# Patient Record
Sex: Male | Born: 1975 | Race: White | Hispanic: No | Smoking: Current every day smoker
Health system: Southern US, Community
[De-identification: ages and names within clinical notes are randomized; demographics above are authoritative.]

## PROBLEM LIST (undated history)

## (undated) DIAGNOSIS — F1910 Other psychoactive substance abuse, uncomplicated: Secondary | ICD-10-CM

---

## 2005-07-11 ENCOUNTER — Emergency Department: Payer: Self-pay | Admitting: Emergency Medicine

## 2006-01-01 ENCOUNTER — Emergency Department: Payer: Self-pay | Admitting: General Practice

## 2006-02-12 ENCOUNTER — Emergency Department: Payer: Self-pay | Admitting: Emergency Medicine

## 2006-03-25 ENCOUNTER — Emergency Department: Payer: Self-pay | Admitting: Emergency Medicine

## 2006-05-12 ENCOUNTER — Emergency Department: Payer: Self-pay

## 2009-03-02 ENCOUNTER — Emergency Department: Payer: Self-pay | Admitting: Emergency Medicine

## 2009-10-03 ENCOUNTER — Emergency Department: Payer: Self-pay | Admitting: Emergency Medicine

## 2009-10-13 ENCOUNTER — Emergency Department: Payer: Self-pay | Admitting: Emergency Medicine

## 2012-04-19 ENCOUNTER — Emergency Department: Payer: Self-pay | Admitting: Emergency Medicine

## 2012-12-04 ENCOUNTER — Emergency Department: Payer: Self-pay | Admitting: Emergency Medicine

## 2012-12-04 LAB — CBC
HCT: 44.5 % (ref 40.0–52.0)
HGB: 15.5 g/dL (ref 13.0–18.0)
MCH: 32.5 pg (ref 26.0–34.0)
MCHC: 34.9 g/dL (ref 32.0–36.0)
MCV: 93 fL (ref 80–100)
Platelet: 222 10*3/uL (ref 150–440)
RBC: 4.78 10*6/uL (ref 4.40–5.90)
RDW: 13.3 % (ref 11.5–14.5)
WBC: 15.9 10*3/uL — ABNORMAL HIGH (ref 3.8–10.6)

## 2012-12-04 LAB — URINALYSIS, COMPLETE
Bilirubin,UR: NEGATIVE
Glucose,UR: NEGATIVE mg/dL (ref 0–75)
Ketone: NEGATIVE
Nitrite: NEGATIVE
Ph: 9 (ref 4.5–8.0)
Squamous Epithelial: NONE SEEN
WBC UR: NONE SEEN /HPF (ref 0–5)

## 2012-12-04 LAB — COMPREHENSIVE METABOLIC PANEL
Alkaline Phosphatase: 122 U/L (ref 50–136)
Anion Gap: 4 — ABNORMAL LOW (ref 7–16)
BUN: 11 mg/dL (ref 7–18)
Calcium, Total: 8.6 mg/dL (ref 8.5–10.1)
Chloride: 109 mmol/L — ABNORMAL HIGH (ref 98–107)
Creatinine: 0.78 mg/dL (ref 0.60–1.30)
EGFR (African American): >60
EGFR (Non-African Amer.): >60
Glucose: 88 mg/dL (ref 65–99)
Osmolality: 280 (ref 275–301)
SGOT(AST): 26 U/L (ref 15–37)
SGPT (ALT): 22 U/L (ref 12–78)
Total Protein: 6.9 g/dL (ref 6.4–8.2)

## 2012-12-04 LAB — CK: CK, Total: 115 U/L (ref 35–232)

## 2012-12-04 LAB — SEDIMENTATION RATE: Erythrocyte Sed Rate: 1 mm/hr (ref 0–15)

## 2014-06-14 ENCOUNTER — Emergency Department: Payer: Self-pay | Admitting: Emergency Medicine

## 2016-07-15 ENCOUNTER — Emergency Department
Admission: EM | Admit: 2016-07-15 | Discharge: 2016-07-16 | Disposition: A | Discharge status: Home / Self Care | Payer: Self-pay | Attending: Emergency Medicine | Admitting: Emergency Medicine

## 2016-07-15 DIAGNOSIS — F10120 Alcohol abuse with intoxication, uncomplicated: Secondary | ICD-10-CM | POA: Insufficient documentation

## 2016-07-15 DIAGNOSIS — F10920 Alcohol use, unspecified with intoxication, uncomplicated: Secondary | ICD-10-CM

## 2016-07-15 DIAGNOSIS — F17210 Nicotine dependence, cigarettes, uncomplicated: Secondary | ICD-10-CM | POA: Insufficient documentation

## 2016-07-15 DIAGNOSIS — Y929 Unspecified place or not applicable: Secondary | ICD-10-CM | POA: Insufficient documentation

## 2016-07-15 DIAGNOSIS — Y9389 Activity, other specified: Secondary | ICD-10-CM | POA: Insufficient documentation

## 2016-07-15 DIAGNOSIS — S0083XA Contusion of other part of head, initial encounter: Secondary | ICD-10-CM

## 2016-07-15 DIAGNOSIS — Z23 Encounter for immunization: Secondary | ICD-10-CM | POA: Insufficient documentation

## 2016-07-15 DIAGNOSIS — S01511A Laceration without foreign body of lip, initial encounter: Secondary | ICD-10-CM | POA: Insufficient documentation

## 2016-07-15 DIAGNOSIS — S0990XA Unspecified injury of head, initial encounter: Secondary | ICD-10-CM | POA: Insufficient documentation

## 2016-07-15 DIAGNOSIS — Y999 Unspecified external cause status: Secondary | ICD-10-CM | POA: Insufficient documentation

## 2016-07-16 ENCOUNTER — Encounter: Payer: Self-pay | Admitting: Emergency Medicine

## 2016-07-16 ENCOUNTER — Emergency Department: Payer: Self-pay

## 2016-07-16 MED ORDER — PENICILLIN V POTASSIUM 500 MG PO TABS: 500.0000 mg | ORAL_TABLET | Freq: Four times a day (QID) | ORAL | 0 refills | Status: AC

## 2016-07-16 MED ORDER — TETANUS-DIPHTH-ACELL PERTUSSIS 5-2.5-18.5 LF-MCG/0.5 IM SUSP
0.5000 mL | Freq: Once | INTRAMUSCULAR | Status: AC
  Administered 2016-07-16: 0.5 mL via INTRAMUSCULAR
  Filled 2016-07-16: qty 0.5

## 2016-07-16 MED ORDER — LIDOCAINE HCL (PF) 1 % IJ SOLN
INTRAMUSCULAR | Status: AC
  Administered 2016-07-16: 02:00:00
  Filled 2016-07-16: qty 10

## 2016-07-16 MED ORDER — PENICILLIN V POTASSIUM 250 MG PO TABS
500.0000 mg | ORAL_TABLET | Freq: Once | ORAL | Status: AC
  Administered 2016-07-16: 500 mg via ORAL
  Filled 2016-07-16: qty 2

## 2016-07-16 MED ORDER — HYDROCODONE-ACETAMINOPHEN 5-325 MG PO TABS
2.0000 | ORAL_TABLET | Freq: Once | ORAL | Status: AC
Start: 2016-07-16 — End: 2016-07-16
  Administered 2016-07-16: 2 via ORAL
  Filled 2016-07-16: qty 2

## 2016-07-16 NOTE — ED Notes (Signed)
Pt ambulatory to restroom with steady gait. No distress noted. Pt asking when he will be able to go home.
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
## 2016-07-16 NOTE — ED Notes (Signed)
Pt ambulatory to nurses station requesting something to drink. Pt provided with meal tray and soda.
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
## 2016-07-16 NOTE — Discharge Instructions (Signed)
Fortunately your CT scans today did not show any internal injuries of your head, face, nor neck.  we repaired your upper lip with sutures that do not have to be removed, and instead will dissolve on their own over time.  Please try to eat only soft foods for at least a week to lower the chance you worsen the laceration or pull out your sutures.    Return to the emergency department if you develop new or worsening symptoms that concern you.

## 2018-09-21 ENCOUNTER — Emergency Department
Admission: EM | Admit: 2018-09-21 | Discharge: 2018-09-21 | Disposition: A | Discharge status: Home / Self Care | Payer: Self-pay | Attending: Emergency Medicine | Admitting: Emergency Medicine

## 2018-09-21 ENCOUNTER — Other Ambulatory Visit: Payer: Self-pay

## 2018-09-21 DIAGNOSIS — F23 Brief psychotic disorder: Secondary | ICD-10-CM

## 2018-09-21 DIAGNOSIS — F17210 Nicotine dependence, cigarettes, uncomplicated: Secondary | ICD-10-CM | POA: Insufficient documentation

## 2018-09-21 DIAGNOSIS — R456 Violent behavior: Secondary | ICD-10-CM | POA: Insufficient documentation

## 2018-09-21 DIAGNOSIS — F1910 Other psychoactive substance abuse, uncomplicated: Secondary | ICD-10-CM

## 2018-09-21 DIAGNOSIS — F1510 Other stimulant abuse, uncomplicated: Secondary | ICD-10-CM | POA: Diagnosis present

## 2018-09-21 DIAGNOSIS — F29 Unspecified psychosis not due to a substance or known physiological condition: Secondary | ICD-10-CM | POA: Insufficient documentation

## 2018-09-21 DIAGNOSIS — R4689 Other symptoms and signs involving appearance and behavior: Secondary | ICD-10-CM | POA: Diagnosis present

## 2018-09-21 LAB — COMPREHENSIVE METABOLIC PANEL
ALK PHOS: 119 U/L (ref 38–126)
ALT: 23 U/L (ref 0–44)
AST: 45 U/L — ABNORMAL HIGH (ref 15–41)
Albumin: 4.9 g/dL (ref 3.5–5.0)
Anion gap: 18 — ABNORMAL HIGH (ref 5–15)
BUN: 18 mg/dL (ref 6–20)
CALCIUM: 9.2 mg/dL (ref 8.9–10.3)
CHLORIDE: 106 mmol/L (ref 98–111)
CO2: 15 mmol/L — AB (ref 22–32)
CREATININE: 1.62 mg/dL — AB (ref 0.61–1.24)
GFR calc Af Amer: 59 mL/min — ABNORMAL LOW (ref >60)
GFR, EST NON AFRICAN AMERICAN: 51 mL/min — AB (ref >60)
Glucose, Bld: 97 mg/dL (ref 70–99)
Potassium: 3.8 mmol/L (ref 3.5–5.1)
Sodium: 139 mmol/L (ref 135–145)
Total Bilirubin: 1.1 mg/dL (ref 0.3–1.2)
Total Protein: 7.9 g/dL (ref 6.5–8.1)

## 2018-09-21 LAB — SALICYLATE LEVEL: Salicylate Lvl: <7 mg/dL (ref 2.8–30.0)

## 2018-09-21 LAB — CBC WITH DIFFERENTIAL/PLATELET
ABS IMMATURE GRANULOCYTES: 0.15 10*3/uL — AB (ref 0.00–0.07)
BASOS ABS: 0.1 10*3/uL (ref 0.0–0.1)
BASOS PCT: 0 %
Eosinophils Absolute: 0 10*3/uL (ref 0.0–0.5)
Eosinophils Relative: 0 %
HCT: 43.3 % (ref 39.0–52.0)
Hemoglobin: 15.4 g/dL (ref 13.0–17.0)
IMMATURE GRANULOCYTES: 1 %
Lymphocytes Relative: 6 %
Lymphs Abs: 1.2 10*3/uL (ref 0.7–4.0)
MCH: 32.2 pg (ref 26.0–34.0)
MCHC: 35.6 g/dL (ref 30.0–36.0)
MCV: 90.4 fL (ref 80.0–100.0)
MONOS PCT: 7 %
Monocytes Absolute: 1.4 10*3/uL — ABNORMAL HIGH (ref 0.1–1.0)
NEUTROS PCT: 86 %
NRBC: 0 % (ref 0.0–0.2)
Neutro Abs: 17.4 10*3/uL — ABNORMAL HIGH (ref 1.7–7.7)
PLATELETS: 275 10*3/uL (ref 150–400)
RBC: 4.79 MIL/uL (ref 4.22–5.81)
RDW: 12.3 % (ref 11.5–15.5)
WBC: 20.2 10*3/uL — ABNORMAL HIGH (ref 4.0–10.5)

## 2018-09-21 LAB — ETHANOL

## 2018-09-21 LAB — ACETAMINOPHEN LEVEL: Acetaminophen (Tylenol), Serum: <10 ug/mL — ABNORMAL LOW (ref 10–30)

## 2018-09-21 MED ORDER — HALOPERIDOL LACTATE 5 MG/ML IJ SOLN
5.0000 mg | Freq: Once | INTRAMUSCULAR | Status: AC
  Administered 2018-09-21: 5 mg via INTRAMUSCULAR

## 2018-09-21 MED ORDER — LORAZEPAM 2 MG/ML IJ SOLN
INTRAMUSCULAR | Status: AC
  Administered 2018-09-21: 2 mg via INTRAMUSCULAR
  Filled 2018-09-21: qty 1

## 2018-09-21 MED ORDER — LORAZEPAM 2 MG/ML IJ SOLN
2.0000 mg | Freq: Once | INTRAMUSCULAR | Status: AC
  Administered 2018-09-21: 2 mg via INTRAMUSCULAR

## 2018-09-21 MED ORDER — DIPHENHYDRAMINE HCL 50 MG/ML IJ SOLN
50.0000 mg | Freq: Once | INTRAMUSCULAR | Status: AC
  Administered 2018-09-21: 50 mg via INTRAVENOUS

## 2018-09-21 MED ORDER — HALOPERIDOL LACTATE 5 MG/ML IJ SOLN
INTRAMUSCULAR | Status: AC
  Administered 2018-09-21: 5 mg via INTRAMUSCULAR
  Filled 2018-09-21: qty 1

## 2018-09-21 MED ORDER — DIPHENHYDRAMINE HCL 50 MG/ML IJ SOLN
INTRAMUSCULAR | Status: AC
  Administered 2018-09-21: 50 mg via INTRAVENOUS
  Filled 2018-09-21: qty 1

## 2018-09-21 NOTE — Discharge Instructions (Addendum)
Please seek medical attention and help for any thoughts about wanting to harm yourself, harm others, any concerning change in behavior, severe depression, inappropriate drug use or any other new or concerning symptoms. ° °

## 2018-09-21 NOTE — ED Notes (Signed)
Belongings given back to pt at this time. Cash unlocked from locker at this time by Engineer, materials and given back to pt. All money is accounted for an counted with pt by this RN and [employer] Technical sales engineer.
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
## 2018-09-21 NOTE — ED Notes (Signed)
Lunch provided at this time, pt refuses food , pt states " I can't eat that nasty **it " PT states he is ready to leave.

## 2018-09-21 NOTE — ED Notes (Signed)
Pt awake and alert, up to use bathroom steady gait noted.

## 2018-09-21 NOTE — ED Provider Notes (Signed)
Patient now awake and alert. Seen by psychiatry who do not feel he is danger to himself or others. Not interested in resources at this time. Will discharge.    Phineas Semen, MD 09/21/18 [id number]

## 2018-09-21 NOTE — ED Notes (Signed)
[place]S officer has called [employer] for ride for pt .

## 2018-09-21 NOTE — ED Triage Notes (Signed)
Pt arrived via [facility] from with c/o drug overdose from unknown substance thought to be crystal meth. EMS states pt was combative and unwilling to cooperative en route. EMS states that pt was more cooperative once arrived. Pt cooperating at this time with triage.
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
## 2018-09-21 NOTE — Consult Note (Signed)
[facility] Face-to-Face Psychiatry Consult   Reason for Consult:  Methamphetamine intoxication Referring Physician:  Dr. Manson Passey Patient Identification: David Bean MRN:  [id number] Principal Diagnosis: Aggressive behavior Diagnosis:  Principal Problem:   Aggressive behavior Active Problems:   Methamphetamine abuse (HCC)   Total Time spent with patient: 1 hour  Subjective:  "I hope they still have my money, I want better food than what you provided."  HPI:   David Bean is a 43 y.o. male patient presents to the emergency department via EMS and police physically restrained secondary to combativeness with reported "crystal meth use".  Police officer states that they were called for a intoxicated person in public on their arrival patient with reported delirium".  Officer states that they found crystal meth on the scene.  Patient has been allowed to sleep overnight.  Patient remained sedated throughout the morning on frequent assessment.  On reevaluation this afternoon, patient is alert and irritable.  He reports "I was having a good time."  He reports he became aggressive with the police because they became aggressive with him.  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  Now that he has been brought to the hospital, he believes his boss will find out and he could be homeless and unemployed.  Patient reports that he has other people he can stay with.  He does not want resources for the shelter.  He does not want resources for substance use treatment.  Patient denies SI, HI, AVH.  He is asking for his belongings so that he may go to see if he can resume work.  Patient does have a vehicle. Patient reports he does not have access to weapons.    Past Psychiatric History: substance abuse  Risk to Self:  Denies Risk to Others:  Denies Prior  Inpatient Therapy:  Denies Prior Outpatient Therapy:  Denies  Past Medical History: History reviewed. No pertinent past medical history. History reviewed. No pertinent surgical history. Family History: History reviewed. No pertinent family history. Family Psychiatric  History: Denies  Social History:  Social History   Substance and Sexual Activity  Alcohol Use Yes     Social History   Substance and Sexual Activity  Drug Use No    Social History   Socioeconomic History  . Marital status: Divorced    Spouse name: Not on file  . Number of children: Not on file  . Years of education: Not on file  . Highest education level: Not on file  Occupational History  . Not on file  Social Needs  . Financial resource strain: Not on file  . Food insecurity:    Worry: Not on file    Inability: Not on file  . Transportation needs:    Medical: Not on file    Non-medical: Not on file  Tobacco Use  . Smoking status: Current Every Day Smoker    Packs/day: 2.00    Types: Cigarettes  . Smokeless tobacco: Never Used  Substance and Sexual Activity  . Alcohol use: Yes  . Drug use: No  . Sexual activity: Not on file  Lifestyle  . Physical activity:    Days per week: Not on file    Minutes per session: Not on file  . Stress: Not on file  Relationships  .  Social connections:    Talks on phone: Not on file    Gets together: Not on file    Attends religious service: Not on file    Active member of club or organization: Not on file    Attends meetings of clubs or organizations: Not on file    Relationship status: Not on file  Other Topics Concern  . Not on file  Social History Narrative  . Not on file   Additional Social History:  Patient had been living with his employer.  Patient works as a Designer, fashion/clothing.   Allergies:   Allergies  Allergen Reactions  . Aspirin     Labs:  Results for orders placed or performed during the hospital encounter of 09/21/18 (from the past 48 hour(s))   Acetaminophen level     Status: Abnormal   Collection Time: 09/21/18  2:00 AM  Result Value Ref Range   Acetaminophen (Tylenol), Serum <10 (L) 10 - 30 ug/mL    Comment: (NOTE) Therapeutic concentrations vary significantly. A range of 10-30 ug/mL  may be an effective concentration for many patients. However, some  are best treated at concentrations outside of this range. Acetaminophen concentrations >150 ug/mL at 4 hours after ingestion  and >50 ug/mL at 12 hours after ingestion are often associated with  toxic reactions. Performed at [facility], [address] [place], [place] [address]   Ethanol     Status: None   Collection Time: 09/21/18  2:00 AM  Result Value Ref Range   Alcohol, Ethyl (B) <10 <10 mg/dL    Comment: (NOTE) Lowest detectable limit for serum alcohol is 10 mg/dL. For medical purposes only. Performed at [facility], [address] [place], [place] [address]   CBC with Differential/Platelet     Status: Abnormal   Collection Time: 09/21/18  2:00 AM  Result Value Ref Range   WBC 20.2 (H) 4.0 - 10.5 K/uL   RBC 4.79 4.22 - 5.81 MIL/uL   Hemoglobin 15.4 13.0 - 17.0 g/dL   HCT 53.6 64.4 - 03.4 %   MCV 90.4 80.0 - 100.0 fL   MCH 32.2 26.0 - 34.0 pg   MCHC 35.6 30.0 - 36.0 g/dL   RDW 74.2 59.5 - 63.8 %   Platelets 275 150 - 400 K/uL   nRBC 0.0 0.0 - 0.2 %   Neutrophils Relative % 86 %   Neutro Abs 17.4 (H) 1.7 - 7.7 K/uL   Lymphocytes Relative 6 %   Lymphs Abs 1.2 0.7 - 4.0 K/uL   Monocytes Relative 7 %   Monocytes Absolute 1.4 (H) 0.1 - 1.0 K/uL   Eosinophils Relative 0 %   Eosinophils Absolute 0.0 0.0 - 0.5 K/uL   Basophils Relative 0 %   Basophils Absolute 0.1 0.0 - 0.1 K/uL   Immature Granulocytes 1 %   Abs Immature Granulocytes 0.15 (H) 0.00 - 0.07 K/uL    Comment: Performed at [facility], [address] [place], [place] [address]  Comprehensive metabolic panel     Status: Abnormal   Collection Time: 09/21/18  2:00  AM  Result Value Ref Range   Sodium 139 135 - 145 mmol/L   Potassium 3.8 3.5 - 5.1 mmol/L   Chloride 106 98 - 111 mmol/L   CO2 15 (L) 22 - 32 mmol/L   Glucose, Bld 97 70 - 99 mg/dL   BUN 18 6 - 20 mg/dL   Creatinine, Ser 3.29 (H) 0.61 - 1.24 mg/dL  Calcium 9.2 8.9 - 10.3 mg/dL   Total Protein 7.9 6.5 - 8.1 g/dL   Albumin 4.9 3.5 - 5.0 g/dL   AST 45 (H) 15 - 41 U/L   ALT 23 0 - 44 U/L   Alkaline Phosphatase 119 38 - 126 U/L   Total Bilirubin 1.1 0.3 - 1.2 mg/dL   GFR calc non Af Amer 51 (L) >60 mL/min   GFR calc Af Amer 59 (L) >60 mL/min   Anion gap 18 (H) 5 - 15    Comment: Performed at [facility], [address] [place], [place] [address]  Salicylate level     Status: None   Collection Time: 09/21/18  2:00 AM  Result Value Ref Range   Salicylate Lvl <7.0 2.8 - 30.0 mg/dL    Comment: Performed at [facility], [address] [place], [place] [address]    No current facility-administered medications for this encounter.    No current outpatient medications on file.    Musculoskeletal: Strength & Muscle Tone: within normal limits Gait & Station: normal Patient leans: N/A  Psychiatric Specialty Exam: Physical Exam  Constitutional: He is oriented to person, place, and time. He appears well-developed and well-nourished. No distress.  HENT:  Head: Normocephalic and atraumatic.  Eyes: EOM are normal.  Neck: Normal range of motion.  Cardiovascular: Normal rate and regular rhythm.  Respiratory: Effort normal. No respiratory distress.  Musculoskeletal: Normal range of motion.  Neurological: He is alert and oriented to person, place, and time.  Skin: Skin is warm and dry.  Face and arms with scabbed lesions    Review of Systems  Constitutional: Negative.   Respiratory: Negative.   Cardiovascular: Negative.   Gastrointestinal: Negative.   Musculoskeletal: Negative.   Neurological: Negative.   Psychiatric/Behavioral: Positive for substance abuse  (methamphetamine). Negative for depression, hallucinations, memory loss and suicidal ideas. The patient is not nervous/anxious and does not have insomnia.     Blood pressure 108/72, pulse 73, temperature 98.4 F (36.9 C), temperature source Oral, resp. rate (!) 21, height 5\' 6"  (1.676 m), weight 81.6 kg, SpO2 99 %.Body mass index is 29.05 kg/m.  General Appearance: Disheveled  Eye Contact:  Minimal  Speech:  Clear and Coherent and Normal Rate  Volume:  Normal  Mood:  Irritable  Affect:  Congruent  Thought Process:  Coherent, Goal Directed, Linear and Descriptions of Associations: Intact  Orientation:  Full (Time, Place, and Person)  Thought Content:  Hallucinations: None  Suicidal Thoughts:  No  Homicidal Thoughts:  No  Memory:  good  Judgement:  Poor  Insight:  Shallow  Psychomotor Activity:  Normal  Concentration:  Concentration: Fair  Recall:  Good  Fund of Knowledge:  Good  Language:  Good  Akathisia:  No  Handed:  Right  AIMS (if indicated):     Assets:  Oceanographer  ADL's:  Intact  Cognition:  WNL  Sleep:   adequate     Treatment Plan Summary: Plan after metabolism patient is denying SI, HI, AVH.  He is able to contract for safety.   Patient does not desire substance use treatment.  Disposition: No evidence of imminent risk to self or others at present.   Patient does not meet criteria for psychiatric inpatient admission. Supportive therapy provided about ongoing stressors. Discussed crisis plan, support from social network, calling 911, coming to the Emergency Department, and calling Suicide Hotline.   Mariel Craft, MD 09/21/2018 10:42 AM

## 2018-09-21 NOTE — ED Notes (Signed)
assumed care of patient. As per security. Police officers called to give patient ride back home. Patient sleeping comfortably. Discharge paper given by prior nurse.

## 2019-09-04 ENCOUNTER — Emergency Department: Payer: Self-pay

## 2019-09-04 ENCOUNTER — Other Ambulatory Visit: Payer: Self-pay

## 2019-09-04 ENCOUNTER — Emergency Department
Admission: EM | Admit: 2019-09-04 | Discharge: 2019-09-05 | Disposition: A | Discharge status: Home / Self Care | Payer: Self-pay | Attending: Emergency Medicine | Admitting: Emergency Medicine

## 2019-09-04 DIAGNOSIS — F1910 Other psychoactive substance abuse, uncomplicated: Secondary | ICD-10-CM | POA: Insufficient documentation

## 2019-09-04 DIAGNOSIS — R55 Syncope and collapse: Secondary | ICD-10-CM | POA: Insufficient documentation

## 2019-09-04 DIAGNOSIS — F17210 Nicotine dependence, cigarettes, uncomplicated: Secondary | ICD-10-CM | POA: Insufficient documentation

## 2019-09-04 LAB — COMPREHENSIVE METABOLIC PANEL
ALT: 23 U/L (ref 0–44)
AST: 22 U/L (ref 15–41)
Albumin: 4.1 g/dL (ref 3.5–5.0)
Alkaline Phosphatase: 125 U/L (ref 38–126)
Anion gap: 9 (ref 5–15)
BUN: 16 mg/dL (ref 6–20)
CO2: 27 mmol/L (ref 22–32)
Calcium: 9.2 mg/dL (ref 8.9–10.3)
Chloride: 105 mmol/L (ref 98–111)
Creatinine, Ser: 0.86 mg/dL (ref 0.61–1.24)
GFR calc Af Amer: >60 mL/min (ref >60)
GFR calc non Af Amer: >60 mL/min (ref >60)
Glucose, Bld: 106 mg/dL — ABNORMAL HIGH (ref 70–99)
Potassium: 4.1 mmol/L (ref 3.5–5.1)
Sodium: 141 mmol/L (ref 135–145)
Total Bilirubin: 0.7 mg/dL (ref 0.3–1.2)
Total Protein: 7.4 g/dL (ref 6.5–8.1)

## 2019-09-04 LAB — CBC WITH DIFFERENTIAL/PLATELET
Abs Immature Granulocytes: 0.03 10*3/uL (ref 0.00–0.07)
Basophils Absolute: 0 10*3/uL (ref 0.0–0.1)
Basophils Relative: 0 %
Eosinophils Absolute: 0.3 10*3/uL (ref 0.0–0.5)
Eosinophils Relative: 2 %
HCT: 45.5 % (ref 39.0–52.0)
Hemoglobin: 15.4 g/dL (ref 13.0–17.0)
Immature Granulocytes: 0 %
Lymphocytes Relative: 25 %
Lymphs Abs: 3.1 10*3/uL (ref 0.7–4.0)
MCH: 31.3 pg (ref 26.0–34.0)
MCHC: 33.8 g/dL (ref 30.0–36.0)
MCV: 92.5 fL (ref 80.0–100.0)
Monocytes Absolute: 1.1 10*3/uL — ABNORMAL HIGH (ref 0.1–1.0)
Monocytes Relative: 9 %
Neutro Abs: 7.7 10*3/uL (ref 1.7–7.7)
Neutrophils Relative %: 64 %
Platelets: 307 10*3/uL (ref 150–400)
RBC: 4.92 MIL/uL (ref 4.22–5.81)
RDW: 12.6 % (ref 11.5–15.5)
WBC: 12.3 10*3/uL — ABNORMAL HIGH (ref 4.0–10.5)
nRBC: 0 % (ref 0.0–0.2)

## 2019-09-04 LAB — ACETAMINOPHEN LEVEL: Acetaminophen (Tylenol), Serum: <10 ug/mL — ABNORMAL LOW (ref 10–30)

## 2019-09-04 LAB — TROPONIN I (HIGH SENSITIVITY)
Troponin I (High Sensitivity): 3 ng/L (ref <18)
Troponin I (High Sensitivity): 5 ng/L (ref <18)

## 2019-09-04 LAB — SALICYLATE LEVEL: Salicylate Lvl: <7 mg/dL — ABNORMAL LOW (ref 7.0–30.0)

## 2019-09-04 LAB — ETHANOL: Alcohol, Ethyl (B): <10 mg/dL (ref <10)

## 2019-09-04 MED ORDER — LORAZEPAM 2 MG/ML IJ SOLN
0.5000 mg | Freq: Once | INTRAMUSCULAR | Status: AC
  Administered 2019-09-04: 0.5 mg via INTRAVENOUS
  Filled 2019-09-04: qty 1

## 2019-09-04 MED ORDER — SODIUM CHLORIDE 0.9 % IV BOLUS
1000.0000 mL | Freq: Once | INTRAVENOUS | Status: AC
Start: 2019-09-04 — End: 2019-09-04
  Administered 2019-09-04: 12:00:00 1000 mL via INTRAVENOUS

## 2019-09-04 MED ORDER — SODIUM CHLORIDE 0.9 % IV BOLUS
1000.0000 mL | Freq: Once | INTRAVENOUS | Status: AC
  Administered 2019-09-04: 1000 mL via INTRAVENOUS

## 2019-09-04 NOTE — ED Notes (Signed)
Pt resting at this time. RR even and unlabored. NAD noted. Son at bedside at this time.

## 2019-09-04 NOTE — ED Triage Notes (Signed)
Pt  via [facility]. 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 Pt found by coworkers. Per [place] police pt was found with possible heroin and meth by his side. Per EMS pt has been hallucinating on route. Pt disoriented x4. EDP Jessup at bedside.

## 2019-09-04 NOTE — ED Notes (Signed)
md in with pt again pt wakes up, but then goes back to sleep.

## 2019-09-04 NOTE — ED Notes (Signed)
Pt sleeping  nsr on monitor.  Family at bedside.  Iv in place

## 2019-09-04 NOTE — ED Notes (Signed)
UA to complete triage du to pt disoriented x4.

## 2019-09-04 NOTE — ED Notes (Signed)
EDP Jessup at bedside at this time providing status update to son Rawlin Reaume who is at bedside.

## 2019-09-04 NOTE — ED Provider Notes (Signed)
Took over care from Dr. Larinda Buttery. Patient continued with some somnolence but would wake up to verbal stimuli. When he would wake up he would start becoming combative. At this time do think patient's symptoms likely related to drug use. Will continue observation.   Phineas Semen, MD 09/04/19 [phone]

## 2019-09-04 NOTE — ED Notes (Signed)
Resumed care from yessica rn.  Pt sleeping  Iv in place   nsr on monitor.  Family at bedside.

## 2019-09-04 NOTE — ED Notes (Signed)
Son, David Bean at bedside at this time.  MD Jessup at bedside.

## 2019-09-04 NOTE — ED Provider Notes (Signed)
[facility] Emergency Department Provider Note   ____________________________________________   First MD Initiated Contact with Patient 09/04/19 1132     (approximate)  I have reviewed the triage vital signs and the nursing notes.   HISTORY  Chief Complaint Drug Overdose    HPI David Bean is a 44 y.o. male with past medical history of methamphetamine abuse who presents to the ED for syncope.  History is limited due to patient's altered mental status.  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  Police were called to the scene and found an unknown substance in the patient's pockets, thought to either be heroin or methamphetamines.  He arrives awake and alert but confused, having difficulty answering questions or following commands.        No past medical history on file.  Patient Active Problem List   Diagnosis Date Noted  . 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  . Aggressive behavior 09/21/2018    No past surgical history on file.  Prior to Admission medications   Not on File    Allergies Aspirin  No family history on file.  Social History Social History   Tobacco Use  . Smoking status: Current Every Day Smoker    Packs/day: 2.00    Types: Cigarettes  . Smokeless tobacco: Never Used  Substance Use Topics  . Alcohol use: Yes  . Drug use: No    Review of Systems Unable to obtain secondary to altered mental status  ____________________________________________   PHYSICAL EXAM:  VITAL SIGNS: ED Triage Vitals [09/04/19 1133]  Enc Vitals Group     BP (!) 170/129     Pulse Rate 84     Resp 18     Temp      Temp src      SpO2 100 %     Weight      Height      Head Circumference      Peak Flow      Pain Score      Pain Loc      Pain Edu?      Excl. in [place]?     Constitutional: Awake and alert, confused. Eyes: Conjunctivae are normal.  Pupils equal round and reactive to light  bilaterally. Head: Atraumatic. Nose: No congestion/rhinnorhea. Mouth/Throat: Mucous membranes are moist. Neck: Normal ROM Cardiovascular: Normal rate, regular rhythm. Grossly normal heart sounds. Respiratory: Normal respiratory effort.  No retractions. Lungs CTAB. Gastrointestinal: Soft and nontender. No distention. Genitourinary: deferred Musculoskeletal: No lower extremity tenderness nor edema. Neurologic: Unable to follow commands, moving all extremities equally. Skin:  Skin is warm, dry and intact. No rash noted. Psychiatric: Unable to assess.  ____________________________________________   LABS (all labs ordered are listed, but only abnormal results are displayed)  Labs Reviewed  CBC WITH DIFFERENTIAL/PLATELET - Abnormal; Notable for the following components:      Result Value   WBC 12.3 (*)    Monocytes Absolute 1.1 (*)    All other components within normal limits  COMPREHENSIVE METABOLIC PANEL - Abnormal; Notable for the following components:   Glucose, Bld 106 (*)    All other components within normal limits  ACETAMINOPHEN LEVEL - Abnormal; Notable for the following components:   Acetaminophen (Tylenol), Serum <10 (*)    All other components within normal limits  SALICYLATE LEVEL - Abnormal; Notable for the following components:   Salicylate Lvl <7.0 (*)    All other components within  normal limits  ETHANOL  URINALYSIS, COMPLETE (UACMP) WITH MICROSCOPIC  URINE DRUG SCREEN, QUALITATIVE ([facility] ONLY)  TROPONIN I (HIGH SENSITIVITY)  TROPONIN I (HIGH SENSITIVITY)   ____________________________________________  EKG  ED ECG REPORT I, Blake Divine, the attending physician, personally viewed and interpreted this ECG.   Date: 09/04/2019  EKG Time: 11:37  Rate: 78  Rhythm: normal sinus rhythm  Axis: Normal  Intervals:none  ST&T Change: None   PROCEDURES  Procedure(s) performed (including Critical  Care):  Procedures   ____________________________________________   INITIAL IMPRESSION / ASSESSMENT AND PLAN / ED COURSE       44 year old male with history of methamphetamine abuse presents to the ED after he was found to have passed out while working on a roof.  He arrives awake and alert, but confused with difficulty following commands, no obvious focal neurologic deficits.  EKG without evidence of arrhythmia or ischemia.  He required a small dose of Ativan to assist with CT head, which was negative.  Lab work is also unremarkable, patient now more somnolent.  We will plan to observe patient given episode likely due to drug intoxication.  If he returns to his baseline mental status, he would be appropriate for discharge home.  Patient turned over to oncoming provider.      ____________________________________________   FINAL CLINICAL IMPRESSION(S) / ED DIAGNOSES  Final diagnoses:  Syncope, unspecified syncope type  Polysubstance abuse [facility])     ED Discharge Orders    None       Note:  This document was prepared using Dragon voice recognition software and may include unintentional dictation errors.   Blake Divine, MD 09/04/19 [phone]

## 2019-09-04 NOTE — Discharge Instructions (Signed)
Please seek medical attention for any high fevers, chest pain, shortness of breath, change in behavior, persistent vomiting, bloody stool or any other new or concerning symptoms.  

## 2019-09-04 NOTE — ED Notes (Signed)
Pt continues to sleep.  nsr on monitor.  md aware.

## 2019-09-04 NOTE — ED Notes (Signed)
Pt has not voided and continues to sleep, when he wakes up, pt curses at staff.  Iv fluids infusing.

## 2019-09-05 NOTE — ED Notes (Signed)
Pt awake, iv dc'ed.  Pt ambulating without diff.  D/c inst to pt.

## 2019-09-20 DIAGNOSIS — S0292XA Unspecified fracture of facial bones, initial encounter for closed fracture: Secondary | ICD-10-CM

## 2019-09-20 HISTORY — DX: Unspecified fracture of facial bones, initial encounter for closed fracture: S02.92XA

## 2019-09-21 DIAGNOSIS — F1510 Other stimulant abuse, uncomplicated: Secondary | ICD-10-CM | POA: Insufficient documentation

## 2019-09-21 DIAGNOSIS — F1410 Cocaine abuse, uncomplicated: Secondary | ICD-10-CM | POA: Insufficient documentation

## 2019-12-20 ENCOUNTER — Emergency Department
Admission: EM | Admit: 2019-12-20 | Discharge: 2019-12-20 | Disposition: A | Discharge status: Home / Self Care | Payer: Self-pay | Attending: Emergency Medicine | Admitting: Emergency Medicine

## 2019-12-20 ENCOUNTER — Encounter: Payer: Self-pay | Admitting: Emergency Medicine

## 2019-12-20 ENCOUNTER — Other Ambulatory Visit: Payer: Self-pay

## 2019-12-20 DIAGNOSIS — F17210 Nicotine dependence, cigarettes, uncomplicated: Secondary | ICD-10-CM | POA: Insufficient documentation

## 2019-12-20 DIAGNOSIS — K649 Unspecified hemorrhoids: Secondary | ICD-10-CM | POA: Insufficient documentation

## 2019-12-20 MED ORDER — HYDROCORTISONE 1 % EX CREA: 1.0000 "application " | TOPICAL_CREAM | Freq: Two times a day (BID) | CUTANEOUS | 0 refills | Status: DC

## 2019-12-20 MED ORDER — LIDOCAINE 5 % EX OINT: 1.0000 "application " | TOPICAL_OINTMENT | CUTANEOUS | 0 refills | Status: DC | PRN

## 2019-12-20 NOTE — ED Provider Notes (Signed)
[facility] Emergency Department Provider Note   ____________________________________________    I have reviewed the triage vital signs and the nursing notes.   HISTORY  Chief Complaint Hemorrhoids     HPI David Bean is a 44 y.o. male with a history as noted below presents with complaints of hemorrhoid pain. Patient notes long hx of hemorrhoids, has never followed up with surgery in the past although admits to frequent referrals. Denies bleeding. Describes soreness with pressure and defecation. Has not taken anything for this. Works as a Theme park manager   History reviewed. No pertinent past medical history.  Patient Active Problem List   Diagnosis Date Noted  . Methamphetamine abuse ([place]) 09/21/2018  . Aggressive behavior 09/21/2018    History reviewed. No pertinent surgical history.  Prior to Admission medications   Medication Sig Start Date End Date Taking? Authorizing Provider  hydrocortisone cream (PREPARATION H) 1 % Apply 1 application topically 2 (two) times daily. 12/20/19   Lavonia Drafts, MD  lidocaine (XYLOCAINE) 5 % ointment Apply 1 application topically as needed. 12/20/19   Lavonia Drafts, MD     Allergies Aspirin  No family history on file.  Social History Social History   Tobacco Use  . Smoking status: Current Every Day Smoker    Packs/day: 2.00    Types: Cigarettes  . Smokeless tobacco: Never Used  Substance Use Topics  . Alcohol use: Yes  . Drug use: No    Review of Systems  Constitutional: No fever/chills     Gastrointestinal: No abdominal pain.  No nausea, no vomiting.  As above  Skin: Negative for rash.     ____________________________________________   PHYSICAL EXAM:  VITAL SIGNS: ED Triage Vitals  Enc Vitals Group     BP 12/20/19 1121 127/81     Pulse Rate 12/20/19 1121 72     Resp 12/20/19 1121 16     Temp 12/20/19 1121 98.5 F (36.9 C)     Temp Source 12/20/19 1121 Oral     SpO2 12/20/19  1121 99 %     Weight 12/20/19 1123 81.6 kg (180 lb)     Height 12/20/19 1123 1.753 m (5\' 9" )     Head Circumference --      Peak Flow --      Pain Score 12/20/19 1123 10     Pain Loc --      Pain Edu? --      Excl. in [place]? --     Constitutional: Alert and oriented. No acute distress.  Nose: No congestion/rhinnorhea. Mouth/Throat: Mucous membranes are moist.   Cardiovascular: Normal rate, regular rhythm.  Respiratory: Normal respiratory effort.  No retractions. Abdomen: soft, non-tender. Large non-bleeding hemorrhoid, pink, possibly partially thrombosed  Neurologic:  Normal speech and language. No gross focal neurologic deficits are appreciated.   Skin:  Skin is warm, dry and intact.    ____________________________________________   LABS (all labs ordered are listed, but only abnormal results are displayed)  Labs Reviewed - No data to display ____________________________________________  EKG   ____________________________________________  RADIOLOGY   ____________________________________________   PROCEDURES  Procedure(s) performed: No  Procedures   Critical Care performed: No ____________________________________________   INITIAL IMPRESSION / ASSESSMENT AND PLAN / ED COURSE  Pertinent labs & imaging results that were available during my care of the patient were reviewed by me and considered in my medical decision making (see chart for details).  Patient with hemorrhoid on exam. Rx for topical lidocaine, preparation  H, recommend sitz baths, stool softeners. Referral to gen surg   ____________________________________________   FINAL CLINICAL IMPRESSION(S) / ED DIAGNOSES  Final diagnoses:  Hemorrhoids, unspecified hemorrhoid type      NEW MEDICATIONS STARTED DURING THIS VISIT:  Discharge Medication List as of 12/20/2019 11:31 AM    START taking these medications   Details  hydrocortisone cream (PREPARATION H) 1 % Apply 1 application topically 2  (two) times daily., Starting Thu 12/20/2019, Print    lidocaine (XYLOCAINE) 5 % ointment Apply 1 application topically as needed., Starting Thu 12/20/2019, Print         Note:  This document was prepared using Dragon voice recognition software and may include unintentional dictation errors.   Jene Every, MD 12/20/19 1246

## 2019-12-20 NOTE — ED Triage Notes (Addendum)
Patient presents to the ED with hemorrhoid near his rectum.  Patient states he has known about area for a long time and states, "I was supposed to get this fixed the last time this happened but I didn't have time.  Patient sleeping when called in triage and was somewhat difficult to arouse.  Patient was able to walk to treatment room with steady gait but immediately wanted to go back to sleep once he was in treatment room.  Patient states, "It feels better to be asleep than to be awake with the pain."  Patient states rectal pain began yesterday.

## 2020-01-14 ENCOUNTER — Inpatient Hospital Stay (HOSPITAL_COMMUNITY)
Admission: EM | Admit: 2020-01-14 | Discharge: 2020-01-18 | DRG: 917 | Disposition: A | Discharge status: Home / Self Care | Payer: Self-pay | Attending: Internal Medicine | Admitting: Internal Medicine

## 2020-01-14 ENCOUNTER — Emergency Department (HOSPITAL_COMMUNITY): Payer: Self-pay

## 2020-01-14 ENCOUNTER — Encounter (HOSPITAL_COMMUNITY): Payer: Self-pay | Admitting: Internal Medicine

## 2020-01-14 DIAGNOSIS — Y929 Unspecified place or not applicable: Secondary | ICD-10-CM

## 2020-01-14 DIAGNOSIS — Z79899 Other long term (current) drug therapy: Secondary | ICD-10-CM

## 2020-01-14 DIAGNOSIS — Z781 Physical restraint status: Secondary | ICD-10-CM

## 2020-01-14 DIAGNOSIS — Z886 Allergy status to analgesic agent status: Secondary | ICD-10-CM

## 2020-01-14 DIAGNOSIS — F1910 Other psychoactive substance abuse, uncomplicated: Secondary | ICD-10-CM | POA: Diagnosis present

## 2020-01-14 DIAGNOSIS — G92 Toxic encephalopathy: Secondary | ICD-10-CM | POA: Diagnosis present

## 2020-01-14 DIAGNOSIS — R410 Disorientation, unspecified: Secondary | ICD-10-CM

## 2020-01-14 DIAGNOSIS — T405X1A Poisoning by cocaine, accidental (unintentional), initial encounter: Principal | ICD-10-CM | POA: Diagnosis present

## 2020-01-14 DIAGNOSIS — N179 Acute kidney failure, unspecified: Secondary | ICD-10-CM | POA: Diagnosis present

## 2020-01-14 DIAGNOSIS — R451 Restlessness and agitation: Secondary | ICD-10-CM

## 2020-01-14 DIAGNOSIS — Z590 Homelessness: Secondary | ICD-10-CM

## 2020-01-14 DIAGNOSIS — R45851 Suicidal ideations: Secondary | ICD-10-CM | POA: Diagnosis present

## 2020-01-14 DIAGNOSIS — D72829 Elevated white blood cell count, unspecified: Secondary | ICD-10-CM | POA: Diagnosis present

## 2020-01-14 DIAGNOSIS — F17210 Nicotine dependence, cigarettes, uncomplicated: Secondary | ICD-10-CM | POA: Diagnosis present

## 2020-01-14 DIAGNOSIS — E860 Dehydration: Secondary | ICD-10-CM | POA: Diagnosis present

## 2020-01-14 DIAGNOSIS — F15129 Other stimulant abuse with intoxication, unspecified: Secondary | ICD-10-CM | POA: Diagnosis present

## 2020-01-14 DIAGNOSIS — Z20822 Contact with and (suspected) exposure to covid-19: Secondary | ICD-10-CM | POA: Diagnosis present

## 2020-01-14 HISTORY — DX: Other psychoactive substance abuse, uncomplicated: F19.10

## 2020-01-14 HISTORY — DX: Acute kidney failure, unspecified: N17.9

## 2020-01-14 LAB — CBC WITH DIFFERENTIAL/PLATELET
Abs Immature Granulocytes: 0.08 10*3/uL — ABNORMAL HIGH (ref 0.00–0.07)
Basophils Absolute: 0.1 10*3/uL (ref 0.0–0.1)
Basophils Relative: 0 %
Eosinophils Absolute: 0.1 10*3/uL (ref 0.0–0.5)
Eosinophils Relative: 0 %
HCT: 51.8 % (ref 39.0–52.0)
Hemoglobin: 17.6 g/dL — ABNORMAL HIGH (ref 13.0–17.0)
Immature Granulocytes: 1 %
Lymphocytes Relative: 8 %
Lymphs Abs: 1.3 10*3/uL (ref 0.7–4.0)
MCH: 31.4 pg (ref 26.0–34.0)
MCHC: 34 g/dL (ref 30.0–36.0)
MCV: 92.3 fL (ref 80.0–100.0)
Monocytes Absolute: 1.1 10*3/uL — ABNORMAL HIGH (ref 0.1–1.0)
Monocytes Relative: 7 %
Neutro Abs: 14.3 10*3/uL — ABNORMAL HIGH (ref 1.7–7.7)
Neutrophils Relative %: 84 %
Platelets: 271 10*3/uL (ref 150–400)
RBC: 5.61 MIL/uL (ref 4.22–5.81)
RDW: 12.2 % (ref 11.5–15.5)
WBC: 16.9 10*3/uL — ABNORMAL HIGH (ref 4.0–10.5)
nRBC: 0 % (ref 0.0–0.2)

## 2020-01-14 LAB — CBG MONITORING, ED: Glucose-Capillary: 93 mg/dL (ref 70–99)

## 2020-01-14 LAB — COMPREHENSIVE METABOLIC PANEL
ALT: 20 U/L (ref 0–44)
AST: 20 U/L (ref 15–41)
Albumin: 4.7 g/dL (ref 3.5–5.0)
Alkaline Phosphatase: 138 U/L — ABNORMAL HIGH (ref 38–126)
Anion gap: 16 — ABNORMAL HIGH (ref 5–15)
BUN: 36 mg/dL — ABNORMAL HIGH (ref 6–20)
CO2: 21 mmol/L — ABNORMAL LOW (ref 22–32)
Calcium: 9.8 mg/dL (ref 8.9–10.3)
Chloride: 104 mmol/L (ref 98–111)
Creatinine, Ser: 2.23 mg/dL — ABNORMAL HIGH (ref 0.61–1.24)
GFR calc Af Amer: 40 mL/min — ABNORMAL LOW (ref >60)
GFR calc non Af Amer: 35 mL/min — ABNORMAL LOW (ref >60)
Glucose, Bld: 96 mg/dL (ref 70–99)
Potassium: 4.6 mmol/L (ref 3.5–5.1)
Sodium: 141 mmol/L (ref 135–145)
Total Bilirubin: 0.9 mg/dL (ref 0.3–1.2)
Total Protein: 8.5 g/dL — ABNORMAL HIGH (ref 6.5–8.1)

## 2020-01-14 LAB — ACETAMINOPHEN LEVEL: Acetaminophen (Tylenol), Serum: <10 ug/mL — ABNORMAL LOW (ref 10–30)

## 2020-01-14 LAB — ETHANOL: Alcohol, Ethyl (B): <10 mg/dL (ref <10)

## 2020-01-14 LAB — SARS CORONAVIRUS 2 BY RT PCR (HOSPITAL ORDER, PERFORMED IN ~~LOC~~ HOSPITAL LAB): SARS Coronavirus 2: NEGATIVE

## 2020-01-14 LAB — SALICYLATE LEVEL: Salicylate Lvl: <7 mg/dL — ABNORMAL LOW (ref 7.0–30.0)

## 2020-01-14 MED ORDER — LIDOCAINE 5 % EX OINT
1.0000 "application " | TOPICAL_OINTMENT | Freq: Two times a day (BID) | CUTANEOUS | Status: DC | PRN
  Filled 2020-01-14: qty 35.44

## 2020-01-14 MED ORDER — SODIUM CHLORIDE 0.9 % IV BOLUS
1000.0000 mL | Freq: Once | INTRAVENOUS | Status: AC
  Administered 2020-01-14: 1000 mL via INTRAVENOUS

## 2020-01-14 MED ORDER — LORAZEPAM 2 MG/ML IJ SOLN
1.0000 mg | INTRAMUSCULAR | Status: AC | PRN
  Administered 2020-01-15 – 2020-01-16 (×6): 1 mg via INTRAVENOUS
  Filled 2020-01-14 (×6): qty 1

## 2020-01-14 MED ORDER — HYDROCORTISONE 1 % EX CREA
1.0000 "application " | TOPICAL_CREAM | Freq: Two times a day (BID) | CUTANEOUS | Status: DC
  Filled 2020-01-14: qty 28

## 2020-01-14 MED ORDER — ENOXAPARIN SODIUM 40 MG/0.4ML ~~LOC~~ SOLN
40.0000 mg | Freq: Every day | SUBCUTANEOUS | Status: DC
  Filled 2020-01-14 (×3): qty 0.4

## 2020-01-14 MED ORDER — SODIUM CHLORIDE 0.9 % IV SOLN: INTRAVENOUS | Status: DC

## 2020-01-14 MED ORDER — IBUPROFEN 400 MG PO TABS: 400.0000 mg | ORAL_TABLET | Freq: Four times a day (QID) | ORAL | Status: DC | PRN

## 2020-01-14 MED ORDER — DEXTROSE-NACL 5-0.45 % IV SOLN: INTRAVENOUS | Status: AC

## 2020-01-14 MED ORDER — LORAZEPAM 1 MG PO TABS
1.0000 mg | ORAL_TABLET | ORAL | Status: AC | PRN
  Filled 2020-01-14: qty 1

## 2020-01-14 NOTE — ED Notes (Addendum)
Pt tearful. Stating "I want to leave so I can blow my brains out." Pt mumbling about his mom and wife, cussing frequently, mostly unintelligible otherwise.

## 2020-01-14 NOTE — ED Notes (Signed)
Pt placed on condom catheter. 

## 2020-01-14 NOTE — ED Triage Notes (Signed)
Pt BIB [facility] from [facility]. EMS called by a business, pt found in woods behind business, spitting, stating "go ahead and shoot me". Pt uncooperative with questioning, bug bites noted all over abdomen. VSS. NAD noted.   130/82 HR 116 RR 20 94% RA CBG 194 98.4 oral

## 2020-01-14 NOTE — ED Provider Notes (Signed)
[facility] EMERGENCY DEPARTMENT Provider Note   CSN: [id number] Arrival date & time: 01/14/20  1639     History Chief Complaint  Patient presents with   Altered Mental Status    David Bean is a 44 y.o. male.  Patient with history of polysubstance abuse including previous ED visits for methamphetamine use presents the emergency department by EMS.  Level 5 caveat due to intoxication and patient cooperation.  Per EMS report, patient was seen in the woods behind a business.  EMS was called.  Patient was acting regularly, spitting, stating "go ahead and shoot me".  Patient stated his name to registration on arrival.  He is not willing to answer other questions.        No past medical history on file.  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     No family history on file.  Social History   Tobacco Use   Smoking status: Current Every Day Smoker    Packs/day: 2.00    Types: Cigarettes   Smokeless tobacco: Never Used  Substance Use Topics   Alcohol use: Yes   Drug use: No    Home Medications Prior to Admission medications   Medication Sig Start Date End Date Taking? Authorizing Provider  hydrocortisone cream (PREPARATION H) 1 % Apply 1 application topically 2 (two) times daily. 12/20/19   Jene Every, MD  lidocaine (XYLOCAINE) 5 % ointment Apply 1 application topically as needed. 12/20/19   Jene Every, MD    Allergies    Aspirin  Review of Systems   Review of Systems  Unable to perform ROS: Mental status change    Physical Exam Updated Vital Signs BP 131/87 (BP Location: Right Arm)    Pulse 96    Temp 98.1 F (36.7 C) (Oral)    Resp 19    SpO2 97%   Physical Exam Vitals and nursing note reviewed.  Constitutional:      Appearance: He is well-developed.  HENT:     Head: Normocephalic and atraumatic.       Right Ear: External ear normal.     Left Ear: External ear normal.     Nose: Nose normal.     Mouth/Throat:     Mouth: Mucous membranes are moist.  Eyes:     General:        Right eye: No discharge.        Left eye: No discharge.     Conjunctiva/sclera: Conjunctivae normal.  Cardiovascular:     Rate and Rhythm: Normal rate and regular rhythm.     Heart sounds: Normal heart sounds.  Pulmonary:     Effort: Pulmonary effort is normal.     Breath sounds: Normal breath sounds.  Abdominal:     Palpations: Abdomen is soft.     Tenderness: There is no abdominal tenderness. There is no guarding or rebound.  Musculoskeletal:     Cervical back: Normal range of motion and neck supple.  Skin:    General: Skin is warm and dry.  Neurological:     Mental Status: He is alert.     Comments: Patient is alert, mildly agitated.  Uncooperative with neurologic exam.  Psychiatric:        Mood and Affect: Mood is depressed. Affect is tearful.     ED Results / Procedures / Treatments  Labs (all labs ordered are listed, but only abnormal results are displayed) Labs Reviewed  COMPREHENSIVE METABOLIC PANEL - Abnormal; Notable for the following components:      Result Value   CO2 21 (*)    BUN 36 (*)    Creatinine, Ser 2.23 (*)    Total Protein 8.5 (*)    Alkaline Phosphatase 138 (*)    GFR calc non Af Amer 35 (*)    GFR calc Af Amer 40 (*)    Anion gap 16 (*)    All other components within normal limits  CBC WITH DIFFERENTIAL/PLATELET - Abnormal; Notable for the following components:   WBC 16.9 (*)    Hemoglobin 17.6 (*)    Neutro Abs 14.3 (*)    Monocytes Absolute 1.1 (*)    Abs Immature Granulocytes 0.08 (*)    All other components within normal limits  SALICYLATE LEVEL - Abnormal; Notable for the following components:   Salicylate Lvl <7.0 (*)    All other components within normal limits  ACETAMINOPHEN LEVEL - Abnormal; Notable for the following components:   Acetaminophen  (Tylenol), Serum <10 (*)    All other components within normal limits  SARS CORONAVIRUS 2 BY RT PCR (HOSPITAL ORDER, PERFORMED IN [facility])  ETHANOL  RAPID URINE DRUG SCREEN, HOSP PERFORMED  CBG MONITORING, ED    EKG EKG Interpretation  Date/Time:  Monday January 14 2020 16:44:28 EDT Ventricular Rate:  95 PR Interval:    QRS Duration: 95 QT Interval:  351 QTC Calculation: 442 R Axis:   26 Text Interpretation: Sinus rhythm Left atrial enlargement Anteroseptal infarct, old No significant change since last tracing Confirmed by Linwood Dibbles [phone]) on 01/14/2020 4:46:40 PM   Radiology CT Head Wo Contrast  Result Date: 01/14/2020 CLINICAL DATA:  44 year old male with altered mental status. EXAM: CT HEAD WITHOUT CONTRAST TECHNIQUE: Contiguous axial images were obtained from the base of the skull through the vertex without intravenous contrast. COMPARISON:  Head CT dated 09/04/2019. FINDINGS: Brain: The ventricles and sulci are appropriate size for patient's age. The gray-white matter discrimination is preserved. There is no acute intracranial hemorrhage. No mass effect or midline shift. No extra-axial fluid collection. Vascular: No hyperdense vessel or unexpected calcification. Skull: Normal. Negative for fracture or focal lesion. Sinuses/Orbits: There is a 12 mm right sphenoid sinus retention cyst or polyp. The remainder of the visualized paranasal sinuses and mastoid air cells are clear. No air-fluid level. There is deviation of the nasal septum to the left. Other: None IMPRESSION: Unremarkable noncontrast CT of the brain. Electronically Signed   By: Elgie Collard M.D.   On: 01/14/2020 19:11    Procedures Procedures (including critical care time)  Medications Ordered in ED Medications - No data to display  ED Course  I have reviewed the triage vital signs and the nursing notes.  Pertinent labs & imaging results that were available during my care of the patient were reviewed  by me and considered in my medical decision making (see chart for details).  Patient seen and examined. ABC intact. Alert and somewhat agitated. Uncooperative with questioning. Work-up pending, EKG reviewed.   Vital signs reviewed and are as follows: BP 131/87 (BP Location: Right Arm)    Pulse 96    Temp 98.1 F (36.7 C) (Oral)    Resp 19    SpO2 97%   7:26 PM patient rechecked.  He is extremely tearful in the room and crying.  He is stating "just  put me out".  I asked him if he had done any drugs today and he said no.  He told me that he is living in the woods.  He is still unable or unwilling to tell me what led up to him being here today.  I discussed results with patient.  Discussed that he needs to be admitted for treatment of kidney injury.  Will call for admission.  7:51 PM Spoke with Dr. Debby Bud, [facility], who will see.     MDM Rules/Calculators/A&P                          Admit for AKI.     Final Clinical Impression(s) / ED Diagnoses Final diagnoses:  Acute kidney injury (HCC)  Polysubstance abuse (HCC)  Confusion    Rx / DC Orders ED Discharge Orders    None       Renne Crigler, Cordelia Poche 01/14/20 1952    Linwood Dibbles, MD 01/15/20 1600

## 2020-01-14 NOTE — ED Notes (Signed)
Walked in to introduce self to Pt. Pt woke up and stated that he wanted to die because his life was messed up. Then Pt stated that he was hungry and wanted a couple of sandwiches.

## 2020-01-14 NOTE — H&P (Addendum)
History and Physical    David Bean JJK:[id number] DOB: [birth year] DOA: 01/14/2020  PCP: Patient, No Pcp Per (Confirm with patient/family/NH records and if not entered, this has to be entered at [facility] point of entry) Patient coming from: home  I have personally briefly reviewed patient's old medical records in [facility]  Chief Complaint: confusion, agitation  HPI: David Bean is a 44 y.o. male with medical history significant of polysubstance abuse including previous ED visits for methamphetamine use presents the emergency department by EMS.  Level 5 caveat due to intoxication and patient cooperation.  Per EMS report, patient was seen in the woods behind a business.  EMS was called.  Patient was acting irregularly, spitting, stating "go ahead and shoot me".     ED Course: Afebrile, VSS. Patient initially uncooperative but over time became more communicative. Lab revealed AKI with Cr 2.23, up from 0;86 09/04/19. IVF started. TRH asked to admit to complete hehydration, f/u lab and consider psych consult  Review of Systems: As per HPI otherwise 10 point review of systems negative. Caveat - patient uncooperative and non-communicative  Past Medical History:  Diagnosis Date   Facial bones, closed fracture (HCC) 09/20/2019   broken nose   Polysubstance abuse (HCC)     No past surgical history on file.   Soc hx - patient will not give any information. Has had multiple ED visits for Overdose. Living situation unknown - apparently homeless.    reports that he has been smoking cigarettes. He has been smoking about 2.00 packs per day. He has never used smokeless tobacco. He reports current alcohol use. He reports that he does not use drugs.  Allergies  Allergen Reactions   Aspirin     No family history on file. Patient will not give any information   Prior to Admission medications   Medication Sig Start Date End Date Taking? Authorizing Provider  hydrocortisone cream  (PREPARATION H) 1 % Apply 1 application topically 2 (two) times daily. 12/20/19   Jene Every, MD  lidocaine (XYLOCAINE) 5 % ointment Apply 1 application topically as needed. 12/20/19   Jene Every, MD    Physical Exam: Vitals:   01/14/20 1643 01/14/20 1830 01/14/20 2118 01/14/20 2200  BP: 131/87 109/68 120/86 95/65  Pulse: 96 89 88 93  Resp: 19 (!) 27 18 20   Temp: 98.1 F (36.7 C)     TempSrc: Oral     SpO2: 97% 98% 95% 96%    Constitutional: NAD, calm, comfortable Vitals:   01/14/20 1643 01/14/20 1830 01/14/20 2118 01/14/20 2200  BP: 131/87 109/68 120/86 95/65  Pulse: 96 89 88 93  Resp: 19 (!) 27 18 20   Temp: 98.1 F (36.7 C)     TempSrc: Oral     SpO2: 97% 98% 95% 96%   General - WNWD man who appears agitated and uncooperative Patient refused physical examination by this examiner Psychiatric: Unable to assess due to lack of cooperation. Appears hostile, agitated, threatening     Labs on Admission: I have personally reviewed following labs and imaging studies  CBC: Recent Labs  Lab 01/14/20 1718  WBC 16.9*  NEUTROABS 14.3*  HGB 17.6*  HCT 51.8  MCV 92.3  PLT 271   Basic Metabolic Panel: Recent Labs  Lab 01/14/20 1718  NA 141  K 4.6  CL 104  CO2 21*  GLUCOSE 96  BUN 36*  CREATININE 2.23*  CALCIUM 9.8   GFR: CrCl cannot be calculated (Unknown ideal weight.).  Liver Function Tests: Recent Labs  Lab 01/14/20 1718  AST 20  ALT 20  ALKPHOS 138*  BILITOT 0.9  PROT 8.5*  ALBUMIN 4.7   No results for input(s): LIPASE, AMYLASE in the last 168 hours. No results for input(s): AMMONIA in the last 168 hours. Coagulation Profile: No results for input(s): INR, PROTIME in the last 168 hours. Cardiac Enzymes: No results for input(s): CKTOTAL, CKMB, CKMBINDEX, TROPONINI in the last 168 hours. BNP (last 3 results) No results for input(s): PROBNP in the last 8760 hours. HbA1C: No results for input(s): HGBA1C in the last 72 hours. CBG: Recent Labs    Lab 01/14/20 1719  GLUCAP 93   Lipid Profile: No results for input(s): CHOL, HDL, LDLCALC, TRIG, CHOLHDL, LDLDIRECT in the last 72 hours. Thyroid Function Tests: No results for input(s): TSH, T4TOTAL, FREET4, T3FREE, THYROIDAB in the last 72 hours. Anemia Panel: No results for input(s): VITAMINB12, FOLATE, FERRITIN, TIBC, IRON, RETICCTPCT in the last 72 hours. Urine analysis:    Component Value Date/Time   COLORURINE Yellow 12/04/2012 1052   APPEARANCEUR Cloudy 12/04/2012 1052   LABSPEC 1.015 12/04/2012 1052   PHURINE 9.0 12/04/2012 1052   GLUCOSEU Negative 12/04/2012 1052   HGBUR Negative 12/04/2012 1052   BILIRUBINUR Negative 12/04/2012 1052   KETONESUR Negative 12/04/2012 1052   PROTEINUR Negative 12/04/2012 1052   NITRITE Negative 12/04/2012 1052   LEUKOCYTESUR Negative 12/04/2012 1052    Radiological Exams on Admission: CT Head Wo Contrast  Result Date: 01/14/2020 CLINICAL DATA:  44 year old male with altered mental status. EXAM: CT HEAD WITHOUT CONTRAST TECHNIQUE: Contiguous axial images were obtained from the base of the skull through the vertex without intravenous contrast. COMPARISON:  Head CT dated 09/04/2019. FINDINGS: Brain: The ventricles and sulci are appropriate size for patient's age. The gray-white matter discrimination is preserved. There is no acute intracranial hemorrhage. No mass effect or midline shift. No extra-axial fluid collection. Vascular: No hyperdense vessel or unexpected calcification. Skull: Normal. Negative for fracture or focal lesion. Sinuses/Orbits: There is a 12 mm right sphenoid sinus retention cyst or polyp. The remainder of the visualized paranasal sinuses and mastoid air cells are clear. No air-fluid level. There is deviation of the nasal septum to the left. Other: None IMPRESSION: Unremarkable noncontrast CT of the brain. Electronically Signed   By: Elgie Collard M.D.   On: 01/14/2020 19:11    EKG: Independently reviewed. Sinus rhythm,  LAE, anteroseptal injury pattern  Assessment/Plan Active Problems:   Polysubstance abuse (HCC)   AKI (acute kidney injury) (HCC)  (please populate well all problems here in Problem List. (For example, if patient is on BP meds at home and you resume or decide to hold them, it is a problem that needs to be her. Same for CAD, COPD, HLD and so on)   1. AKI- suspect dehydration. Eval negative except for rise in creatinine Plan Hydrate D5 1/2 NS 2 75 cc/hr over 24 hrs  F/u BMet  2. Psychiatric - patient with aberrant behavior: hostile and uncooperative. Plan  Psychiatric consult - TTS consult order placed  Ativan 1mg  PO/IV q4 for agitation.  Addendum: patient became more agitated, hostile. He disconnected IVF, left room, was disruptive in the ED hallway and then left the building.  Plan Haldol 5 mg IM was ordered  Restraint orders entered  IVC forms completed.  Patient was forcebly returned to room in ED, put into 4 point restraints for patient and staff safety. Haldol given   Leading to adequate sedation.  DVT prophylaxis: lovenox  Code Status: full code tails) Family Communication: attempted to call son, Willam Munford, not available  Disposition Plan: TBD (specify when and where you expect patient to be discharged) Consults called: Tele psychiatry consult request via TTS order.  Admission status: observation    Illene Regulus MD [facility] Pager [phone]  If 7PM-7AM, please contact night-coverage [email or web address] Password [facility]  01/14/2020, 10:27 PM

## 2020-01-15 DIAGNOSIS — R45851 Suicidal ideations: Secondary | ICD-10-CM

## 2020-01-15 DIAGNOSIS — R451 Restlessness and agitation: Secondary | ICD-10-CM

## 2020-01-15 LAB — CBC WITH DIFFERENTIAL/PLATELET
Abs Immature Granulocytes: 0.02 10*3/uL (ref 0.00–0.07)
Basophils Absolute: 0 10*3/uL (ref 0.0–0.1)
Basophils Relative: 0 %
Eosinophils Absolute: 0.2 10*3/uL (ref 0.0–0.5)
Eosinophils Relative: 2 %
HCT: 43 % (ref 39.0–52.0)
Hemoglobin: 14.2 g/dL (ref 13.0–17.0)
Immature Granulocytes: 0 %
Lymphocytes Relative: 24 %
Lymphs Abs: 2.3 10*3/uL (ref 0.7–4.0)
MCH: 31.3 pg (ref 26.0–34.0)
MCHC: 33 g/dL (ref 30.0–36.0)
MCV: 94.9 fL (ref 80.0–100.0)
Monocytes Absolute: 0.9 10*3/uL (ref 0.1–1.0)
Monocytes Relative: 10 %
Neutro Abs: 6 10*3/uL (ref 1.7–7.7)
Neutrophils Relative %: 64 %
Platelets: 224 10*3/uL (ref 150–400)
RBC: 4.53 MIL/uL (ref 4.22–5.81)
RDW: 12.4 % (ref 11.5–15.5)
WBC: 9.6 10*3/uL (ref 4.0–10.5)
nRBC: 0 % (ref 0.0–0.2)

## 2020-01-15 LAB — HIV ANTIBODY (ROUTINE TESTING W REFLEX): HIV Screen 4th Generation wRfx: NONREACTIVE

## 2020-01-15 LAB — BASIC METABOLIC PANEL
Anion gap: 8 (ref 5–15)
BUN: 26 mg/dL — ABNORMAL HIGH (ref 6–20)
CO2: 26 mmol/L (ref 22–32)
Calcium: 8.8 mg/dL — ABNORMAL LOW (ref 8.9–10.3)
Chloride: 108 mmol/L (ref 98–111)
Creatinine, Ser: 1.1 mg/dL (ref 0.61–1.24)
GFR calc Af Amer: >60 mL/min (ref >60)
GFR calc non Af Amer: >60 mL/min (ref >60)
Glucose, Bld: 107 mg/dL — ABNORMAL HIGH (ref 70–99)
Potassium: 4.1 mmol/L (ref 3.5–5.1)
Sodium: 142 mmol/L (ref 135–145)

## 2020-01-15 LAB — RAPID URINE DRUG SCREEN, HOSP PERFORMED
Amphetamines: POSITIVE — AB
Barbiturates: NOT DETECTED
Benzodiazepines: NOT DETECTED
Cocaine: POSITIVE — AB
Opiates: NOT DETECTED
Tetrahydrocannabinol: NOT DETECTED

## 2020-01-15 LAB — TSH: TSH: 0.454 u[IU]/mL (ref 0.350–4.500)

## 2020-01-15 MED ORDER — HALOPERIDOL LACTATE 5 MG/ML IJ SOLN
5.0000 mg | Freq: Four times a day (QID) | INTRAMUSCULAR | Status: DC | PRN
  Administered 2020-01-15 (×3): 5 mg via INTRAVENOUS
  Filled 2020-01-15 (×4): qty 1

## 2020-01-15 MED ORDER — HALOPERIDOL LACTATE 5 MG/ML IJ SOLN
INTRAMUSCULAR | Status: AC
  Filled 2020-01-15: qty 1

## 2020-01-15 NOTE — Progress Notes (Addendum)
PROGRESS NOTE    David Bean  HMC:[id number] DOB: [birth year] DOA: 01/14/2020 PCP: Patient, No Pcp Per   Chief Complaint  Patient presents with  . Altered Mental Status    Brief Narrative:  David Bean is David Bean 44 y.o. male with medical history significant of polysubstance abuse including previous ED visits for methamphetamine use presents the emergency department by EMS. Level 5 caveat due to intoxication and patient cooperation. Per EMS report, patient was seen in the woods behind [employer] business. EMS was called. Patient was acting irregularly, spitting, stating "go ahead and shoot me".    ED Course: Afebrile, VSS. Patient initially uncooperative but over time became more communicative. Lab revealed AKI with Cr 2.23, up from 0;86 09/04/19. IVF started. TRH asked to admit to complete hehydration, f/u lab and consider psych consult  Assessment & Plan:   Active Problems:   Polysubstance abuse (HCC)   AKI (acute kidney injury) (HCC)  Suicidal Ideation  Agitation  Polysubstance Abuse:  Pt found in the woods, acting irregularly, saying "go ahead and shoot me".  He was brought into the ED by police.  Head CT unremarkable He's been IVC'd Suicide precautions  He's had continued agitation requiring haldol/ativan throughout the day He eloped on night of admission and had to be forcibly returned to his rooms and placed into 4 point restraints  Utox positive for amphetamines and cocaine Follow pending TSH  Sedated this morning on my evaluation Psychiatry has been unable to evaluate him as he's been sedated for agitation   Acute Kidney Injury: resolved Leukocytosis: resolved   DVT prophylaxis: lovenox Code Status: full  Family Communication: none at bedside Disposition:   Status is: Observation  The patient will require care spanning > 2 midnights and should be moved to inpatient because: Inpatient level of care appropriate due to severity of illness  Dispo: The patient is from:  Home              Anticipated d/c is to: suspect he'll need inpatient psych treatment              Anticipated d/c date is: 2 days              Patient currently is not medically stable to d/c.   Consultants:   psychiatry  Procedures:  none  Antimicrobials:  Anti-infectives (From admission, onward)   None     Subjective: Sedated, unable to interview, recently received ativan  Objective: Vitals:   01/15/20 1125 01/15/20 1422 01/15/20 1439 01/15/20 1440  BP: (!) 122/92 119/76 119/76   Pulse: 67 82 (!) 54 78  Resp: 12  20   Temp:      TempSrc:      SpO2: 99% 100% 98%     Intake/Output Summary (Last 24 hours) at 01/15/2020 1615 Last data filed at 01/14/2020 2238 Gross per 24 hour  Intake 1339.58 ml  Output --  Net 1339.58 ml   There were no vitals filed for this visit.  Examination:  General exam: Appears calm and comfortable  Respiratory system: Clear to auscultation. Respiratory effort normal. Cardiovascular system: S1 & S2 heard, RRR.  Gastrointestinal system: Abdomen is nondistended, soft and nontender.  Central nervous system: Sedated, arouses to voice/light touch. No focal neurological deficits. Extremities: moving all extremities  Skin: No rashes, lesions or ulcers Psychiatry: Judgement and insight appear normal. Mood & affect appropriate.     Data Reviewed: I have personally reviewed following labs and imaging studies  CBC: Recent  Labs  Lab 01/14/20 1718 01/15/20 0804  WBC 16.9* 9.6  NEUTROABS 14.3* 6.0  HGB 17.6* 14.2  HCT 51.8 43.0  MCV 92.3 94.9  PLT 271 224    Basic Metabolic Panel: Recent Labs  Lab 01/14/20 1718 01/15/20 0630  NA 141 142  K 4.6 4.1  CL 104 108  CO2 21* 26  GLUCOSE 96 107*  BUN 36* 26*  CREATININE 2.23* 1.10  CALCIUM 9.8 8.8*    GFR: CrCl cannot be calculated (Unknown ideal weight.).  Liver Function Tests: Recent Labs  Lab 01/14/20 1718  AST 20  ALT 20  ALKPHOS 138*  BILITOT 0.9  PROT 8.5*  ALBUMIN  4.7    CBG: Recent Labs  Lab 01/14/20 1719  GLUCAP 93     Recent Results (from the past 240 hour(s))  SARS Coronavirus 2 by RT PCR (hospital order, performed in [facility] hospital lab) Nasopharyngeal Nasopharyngeal Swab     Status: None   Collection Time: 01/14/20  7:30 PM   Specimen: Nasopharyngeal Swab  Result Value Ref Range Status   SARS Coronavirus 2 NEGATIVE NEGATIVE Final    Comment: (NOTE) SARS-CoV-2 target nucleic acids are NOT DETECTED.  The SARS-CoV-2 RNA is generally detectable in upper and lower respiratory specimens during the acute phase of infection. The lowest concentration of SARS-CoV-2 viral copies this assay can detect is 250 copies / mL. Alvino Lechuga negative result does not preclude SARS-CoV-2 infection and should not be used as the sole basis for treatment or other patient management decisions.  Frances Joynt negative result may occur with improper specimen collection / handling, submission of specimen other than nasopharyngeal swab, presence of viral mutation(s) within the areas targeted by this assay, and inadequate number of viral copies (<250 copies / mL). Braxley Balandran negative result must be combined with clinical observations, patient history, and epidemiological information.  Fact Sheet for Patients:   BoilerBrush.com.cy  Fact Sheet for Healthcare Providers: https://pope.com/  This test is not yet approved or  cleared by the [place] FDA and has been authorized for detection and/or diagnosis of SARS-CoV-2 by FDA under an Emergency Use Authorization (EUA).  This EUA will remain in effect (meaning this test can be used) for the duration of the COVID-19 declaration under Section 564(b)(1) of the Act, 21 U.S.C. section 360bbb-3(b)(1), unless the authorization is terminated or revoked sooner.  Performed at [facility], [address] [place], [place] [address]          Radiology Studies: CT Head Wo  Contrast  Result Date: 01/14/2020 CLINICAL DATA:  44 year old male with altered mental status. EXAM: CT HEAD WITHOUT CONTRAST TECHNIQUE: Contiguous axial images were obtained from the base of the skull through the vertex without intravenous contrast. COMPARISON:  Head CT dated 09/04/2019. FINDINGS: Brain: The ventricles and sulci are appropriate size for patient's age. The gray-white matter discrimination is preserved. There is no acute intracranial hemorrhage. No mass effect or midline shift. No extra-axial fluid collection. Vascular: No hyperdense vessel or unexpected calcification. Skull: Normal. Negative for fracture or focal lesion. Sinuses/Orbits: There is Remi Rester 12 mm right sphenoid sinus retention cyst or polyp. The remainder of the visualized paranasal sinuses and mastoid air cells are clear. No air-fluid level. There is deviation of the nasal septum to the left. Other: None IMPRESSION: Unremarkable noncontrast CT of the brain. Electronically Signed   By: Elgie Collard M.D.   On: 01/14/2020 19:11        Scheduled Meds: . enoxaparin (LOVENOX) injection  40 mg Subcutaneous QHS  . hydrocortisone cream  1 application Topical BID   Continuous Infusions: . dextrose 5 % and 0.45% NaCl 75 mL/hr at 01/14/20 2232     LOS: 0 days    Time spent: over 30 min    Lacretia Nicks, MD [facility]   To contact the attending provider between 7A-7P or the covering provider during after hours 7P-7A, please log into the web site [email or web address] and access using universal [place] password for that web site. If you do not have the password, please call the hospital operator.  01/15/2020, 4:15 PM

## 2020-01-15 NOTE — BH Assessment (Signed)
Call to set up TTS assessment. Per pt's RN, pt is sedated at this time. RN will call TTS at [phone] option 1 when pt is ready for TTS.

## 2020-01-15 NOTE — ED Notes (Signed)
Pt no longer in restraints on change of shift assessment. Per previous RN restraints removed at [phone].
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
## 2020-01-15 NOTE — Progress Notes (Signed)
Patient refused skin check and VS.

## 2020-01-15 NOTE — Progress Notes (Signed)
Patient is refusing to be assessed at this time.

## 2020-01-15 NOTE — ED Notes (Signed)
Pt is paranoid and delusional. Pt asked if I were with [place] and asked me to raise my pants leg. Pt stated that he wanted ativan but refused to allow me to scan him. Pt refused ativan.

## 2020-01-15 NOTE — ED Notes (Signed)
Breakfast ordered--David Bean  

## 2020-01-15 NOTE — ED Notes (Signed)
TTS attempted assessment. Per Maralyn Sago, RN, patient is currently being restrained, patient unable to participate in assessment. TTS will complete assessment at later time.

## 2020-01-15 NOTE — ED Notes (Signed)
Pt pulled restraints off wrist with teeth. Pt placed back in restraints. Pt was emotional and crying. Pt went back to sleep after placing back in restraints

## 2020-01-15 NOTE — BH Assessment (Signed)
Per pt's RN, pt is not alert and continues to be uncooperative and in restraints. Will reattempt TTS assessment later in day.

## 2020-01-15 NOTE — ED Notes (Signed)
Pt refused temp 

## 2020-01-15 NOTE — ED Notes (Signed)
At this time patient is noted to be restless and thrashing when awaken. Removed BLE restraints. Will administer PRN ativan at this time.

## 2020-01-15 NOTE — ED Notes (Signed)
Pt got out of restraints after reciveing haldol. Pt cussed out Research officer, trade union. Pt and restraints repositioned

## 2020-01-15 NOTE — Progress Notes (Signed)
NEW ADMISSION NOTE New Admission Note:   Arrival Method: Patient arrived from ED on stretcher accompanied by 2 staff members. Mental Orientation: Alert to self, sleeping. Telemetry: N/A Assessment: Completed Skin: refused  IV: R AC D5 1/2NS @75ml  Pain: none. Tubes: N/A Safety Measures: Safety Fall Prevention Plan has been given, discussed and signed Admission: in process. [facility] [place] Orientation: Patient has been orientated to the room, unit and staff.   Orders have been reviewed and implemented. Will continue to monitor the patient. Call light has been placed within reach and bed alarm has been activated.   , RN

## 2020-01-15 NOTE — ED Notes (Signed)
Have been charting violent restraimnts every 15 min under non-violent restraimnts

## 2020-01-15 NOTE — ED Notes (Signed)
When checking Pt's wrist and ankles Pt awoke and attempted to remove restraints with his mouth again. Pt stated that he wished he could die and began crying. Pt fell back asleep shortly thereafter.

## 2020-01-15 NOTE — ED Notes (Signed)
Pt becoming agitated, ripping off med wires, attempting to get out of bed. Pt refusing VS.

## 2020-01-16 DIAGNOSIS — R410 Disorientation, unspecified: Secondary | ICD-10-CM

## 2020-01-16 NOTE — Consult Note (Signed)
Psychiatry consult attempted by nurse practitioner.  Patient remained lying with eyes closed however appeared awake.  Patient noted with purposeful movements including moving blankets toward face. Per current staff member at bedside patient presents with irritable behavior this shift and refuses all care.  MD made aware, will reattempt psychiatry consult tomorrow.

## 2020-01-16 NOTE — Progress Notes (Signed)
Tried to return a call to patient's son,it just keep on ringing and phone's voicemail is full.

## 2020-01-16 NOTE — Progress Notes (Signed)
PROGRESS NOTE    David Bean  IRC:[id number] DOB: Apr 05, 1976 DOA: 01/14/2020 PCP: Patient, No Pcp Per   Chief Complaint  Patient presents with   Altered Mental Status    Brief Narrative:  David Bean is a 44 y.o. male with medical history significant of polysubstance abuse including previous ED visits for methamphetamine use presents the emergency department by EMS. Level 5 caveat due to intoxication and patient cooperation. Per EMS report, patient was seen in the woods behind a business. EMS was called. Patient was acting irregularly, spitting, stating "go ahead and shoot me".    ED Course: Afebrile, VSS. Patient initially uncooperative but over time became more communicative. Lab revealed AKI with Cr 2.23, up from 0;86 09/04/19. IVF started. TRH asked to admit to complete hehydration, f/u lab and consider psych consult  Assessment & Plan:   Possible suicidal Ideation   Agitation   Polysubstance Abuse:  Pt found in the woods, acting irregularly, saying "go ahead and shoot me".  He was brought into the ED by police.  Head CT unremarkable was IVC'd in the emergency room Now with one-to-one sitter -Required as needed Haldol yesterday, eloped on night of admission and had to be forcibly returned to his rooms and placed into 4 point restraints  Utox positive for amphetamines and cocaine -Psych consult  Acute Kidney Injury: resolved  Leukocytosis: resolved   DVT prophylaxis: lovenox Code Status: full  Family Communication: none at bedside Disposition:   Status is: Observation  The patient will require care spanning > 2 midnights and should be moved to inpatient because: Inpatient level of care appropriate due to severity of illness  Dispo: The patient is from: Home              Anticipated d/c is to: Await psych eval              Anticipated d/c date is: 1-2 days              Patient currently is not medically stable to d/c.   Consultants:    psychiatry  Procedures:  none  Antimicrobials:  Anti-infectives (From admission, onward)   None     Subjective: -Refuses to eat drink, interact with staff  Objective: Vitals:   01/15/20 1439 01/15/20 1440 01/15/20 2127 01/16/20 0304  BP: 119/76  (!) 125/92 (!) 116/91  Pulse: (!) 54 78 88 90  Resp: 20  20 20   Temp:   98.6 F (37 C)   TempSrc:   Oral   SpO2: 98%  100%     Intake/Output Summary (Last 24 hours) at 01/16/2020 1250 Last data filed at 01/16/2020 0942 Gross per 24 hour  Intake 1898.28 ml  Output 1 ml  Net 1897.28 ml   There were no vitals filed for this visit.  Examination:  General: Awake eyes closed, refuses to interact or speak CVS: S1-S2, regular rate rhythm Lungs: Clear Abdomen: Soft, nontender Extremities: No edema Psych and neuro, unable to assess, eyes closed, refuses to interact  Data Reviewed: I have personally reviewed following labs and imaging studies  CBC: Recent Labs  Lab 01/14/20 1718 01/15/20 0804  WBC 16.9* 9.6  NEUTROABS 14.3* 6.0  HGB 17.6* 14.2  HCT 51.8 43.0  MCV 92.3 94.9  PLT 271 224    Basic Metabolic Panel: Recent Labs  Lab 01/14/20 1718 01/15/20 0630  NA 141 142  K 4.6 4.1  CL 104 108  CO2 21* 26  GLUCOSE 96 107*  BUN  36* 26*  CREATININE 2.23* 1.10  CALCIUM 9.8 8.8*    GFR: CrCl cannot be calculated (Unknown ideal weight.).  Liver Function Tests: Recent Labs  Lab 01/14/20 1718  AST 20  ALT 20  ALKPHOS 138*  BILITOT 0.9  PROT 8.5*  ALBUMIN 4.7    CBG: Recent Labs  Lab 01/14/20 1719  GLUCAP 93     Recent Results (from the past 240 hour(s))  SARS Coronavirus 2 by RT PCR (hospital order, performed in [facility] hospital lab) Nasopharyngeal Nasopharyngeal Swab     Status: None   Collection Time: 01/14/20  7:30 PM   Specimen: Nasopharyngeal Swab  Result Value Ref Range Status   SARS Coronavirus 2 NEGATIVE NEGATIVE Final    Comment: (NOTE) SARS-CoV-2 target nucleic acids are NOT  DETECTED.  The SARS-CoV-2 RNA is generally detectable in upper and lower respiratory specimens during the acute phase of infection. The lowest concentration of SARS-CoV-2 viral copies this assay can detect is 250 copies / mL. A negative result does not preclude SARS-CoV-2 infection and should not be used as the sole basis for treatment or other patient management decisions.  A negative result may occur with improper specimen collection / handling, submission of specimen other than nasopharyngeal swab, presence of viral mutation(s) within the areas targeted by this assay, and inadequate number of viral copies (<250 copies / mL). A negative result must be combined with clinical observations, patient history, and epidemiological information.  Fact Sheet for Patients:   BoilerBrush.com.cy  Fact Sheet for Healthcare Providers: https://pope.com/  This test is not yet approved or  cleared by the [place] FDA and has been authorized for detection and/or diagnosis of SARS-CoV-2 by FDA under an Emergency Use Authorization (EUA).  This EUA will remain in effect (meaning this test can be used) for the duration of the COVID-19 declaration under Section 564(b)(1) of the Act, 21 U.S.C. section 360bbb-3(b)(1), unless the authorization is terminated or revoked sooner.  Performed at [facility], [address] [place], [place] [address]          Radiology Studies: CT Head Wo Contrast  Result Date: 01/14/2020 CLINICAL DATA:  44 year old male with altered mental status. EXAM: CT HEAD WITHOUT CONTRAST TECHNIQUE: Contiguous axial images were obtained from the base of the skull through the vertex without intravenous contrast. COMPARISON:  Head CT dated 09/04/2019. FINDINGS: Brain: The ventricles and sulci are appropriate size for patient's age. The gray-white matter discrimination is preserved. There is no acute intracranial hemorrhage. No  mass effect or midline shift. No extra-axial fluid collection. Vascular: No hyperdense vessel or unexpected calcification. Skull: Normal. Negative for fracture or focal lesion. Sinuses/Orbits: There is a 12 mm right sphenoid sinus retention cyst or polyp. The remainder of the visualized paranasal sinuses and mastoid air cells are clear. No air-fluid level. There is deviation of the nasal septum to the left. Other: None IMPRESSION: Unremarkable noncontrast CT of the brain. Electronically Signed   By: Elgie Collard M.D.   On: 01/14/2020 19:11        Scheduled Meds:  enoxaparin (LOVENOX) injection  40 mg Subcutaneous QHS   hydrocortisone cream  1 application Topical BID   Continuous Infusions:    LOS: 1 day    Time spent:  Zannie Cove, MD [facility]   01/16/2020, 12:50 PM

## 2020-01-17 MED ORDER — HALOPERIDOL 5 MG PO TABS
5.0000 mg | ORAL_TABLET | Freq: Every day | ORAL | Status: DC | PRN
  Filled 2020-01-17: qty 1

## 2020-01-17 MED ORDER — HALOPERIDOL LACTATE 5 MG/ML IJ SOLN: 2.0000 mg | Freq: Every day | INTRAMUSCULAR | Status: DC | PRN

## 2020-01-17 NOTE — Consult Note (Signed)
[facility] Face-to-Face Psychiatry Consult   Reason for Consult:  Possible suicide ideation Referring Physician:  Joya Martyr Patient Identification: David Bean MRN:  [id number] Principal Diagnosis: <principal problem not specified> Diagnosis:  Active Problems:   Polysubstance abuse (HCC)   AKI (acute kidney injury) (HCC)   Suicidal ideation   Agitation   Total Time spent with patient: 30 minutes  Subjective:   David Bean is a 44 y.o. male patient admitted with methamphetamine use and psychosis.  Patient is unable to recall events leading up to this current admission.  He reports he was under the influence of multiple substances however is unable to recall when took place.  He denies any previous psychiatric history, and originally denied substance abuse history.  After advised the patient of positive urine drug screen he reported that he does have a history of substance abuse, however is unwilling to seek help at this time.  He reports current legal charges to include his first DWI for alcohol.  He denies any previous outpatient therapy, inpatient admission, suicide attempt.  Patient is alert and oriented x3, is calm and cooperative and grudgingly participating in assessment.  Throughout the entire assessment patient had back turn to writer, have poor eye contact.  Patient remains a poor historian and unable to recall events leading up to this admission.  He denies any previous psychiatric history, with the exception of substance abuse.  He denies any previous suicide attempt, suicidal ideation, suicidal gestures and or nonsuicidal self injuries behavior.  He does report of history of polysubstance abuse, however shows no motivation to seek treatment.  When inquiring about legal charges he reports" I have my first DWI 66 I think I am doing good."  He denies wanting any assistance for substance abuse treatment, whether inpatient, outpatient, and or long-term.  He has no insight into his substance  use at this time.  HPI:  David Hehn Mooreis a 44 y.o.malewith medical history significant ofpolysubstance abuse including previous ED visits for methamphetamine use presents the emergency department by EMS. Level 5 caveat due to intoxication and patient cooperation. Per EMS report, patient was seen in the woods behind a business. EMS was called. Patient was actingirregularly, spitting, stating "go ahead and shoot me".  Past Psychiatric History: Substance abuse.  Patient denies any current outpatient providers.  Patient denies any recent inpatient admission.  Patient denies outpatient or inpatient therapy for substance abuse.  Patient denies any previous suicide attempts, self harm injuries.   Risk to Self:  Denies Risk to Others:  Denies Prior Inpatient Therapy:  Denies Prior Outpatient Therapy:  Denies  Past Medical History:  Past Medical History:  Diagnosis Date  . Facial bones, closed fracture (HCC) 09/20/2019   broken nose  . Polysubstance abuse (HCC)    No past surgical history on file. Family History: No family history on file. Family Psychiatric  History: Denied Social History:  Social History   Substance and Sexual Activity  Alcohol Use Yes     Social History   Substance and Sexual Activity  Drug Use No    Social History   Socioeconomic History  . Marital status: Divorced    Spouse name: Not on file  . Number of children: Not on file  . Years of education: Not on file  . Highest education level: Not on file  Occupational History  . Not on file  Tobacco Use  . Smoking status: Current Every Day Smoker    Packs/day: 2.00    Types:  Cigarettes  . Smokeless tobacco: Never Used  Substance and Sexual Activity  . Alcohol use: Yes  . Drug use: No  . Sexual activity: Not on file  Other Topics Concern  . Not on file  Social History Narrative  . Not on file   Social Determinants of Health   Financial Resource Strain:   . Difficulty of Paying Living  Expenses:   Food Insecurity:   . Worried About Programme researcher, broadcasting/film/video in the Last Year:   . Barista in the Last Year:   Transportation Needs:   . Freight forwarder (Medical):   Marland Kitchen Lack of Transportation (Non-Medical):   Physical Activity:   . Days of Exercise per Week:   . Minutes of Exercise per Session:   Stress:   . Feeling of Stress :   Social Connections:   . Frequency of Communication with Friends and Family:   . Frequency of Social Gatherings with Friends and Family:   . Attends Religious Services:   . Active Member of Clubs or Organizations:   . Attends Banker Meetings:   Marland Kitchen Marital Status:    Additional Social History:    Allergies:   Allergies  Allergen Reactions  . Aspirin Anaphylaxis    Labs:  Results for orders placed or performed during the hospital encounter of 01/14/20 (from the past 48 hour(s))  TSH     Status: None   Collection Time: 01/15/20  4:32 PM  Result Value Ref Range   TSH 0.454 0.350 - 4.500 uIU/mL    Comment: Performed by a 3rd Generation assay with a functional sensitivity of <=0.01 uIU/mL. Performed at [facility], [address], [place], [place] [address]     Current Facility-Administered Medications  Medication Dose Route Frequency Provider Last Rate Last Admin  . enoxaparin (LOVENOX) injection 40 mg  40 mg Subcutaneous QHS Norins, Rosalyn Gess, MD      . haloperidol (HALDOL) tablet 5 mg  5 mg Oral Daily PRN Zannie Cove, MD       Or  . haloperidol lactate (HALDOL) injection 2 mg  2 mg Intramuscular Daily PRN Zannie Cove, MD      . hydrocortisone cream 1 % 1 application  1 application Topical BID Norins, Rosalyn Gess, MD      . ibuprofen (ADVIL) tablet 400 mg  400 mg Oral Q6H PRN Norins, Rosalyn Gess, MD      . lidocaine (XYLOCAINE) 5 % ointment 1 application  1 application Topical BID PRN Norins, Rosalyn Gess, MD        Musculoskeletal: Strength & Muscle Tone: within normal limits Gait & Station:  normal Patient leans: N/A  Psychiatric Specialty Exam: Physical Exam  Review of Systems  Blood pressure 103/60, pulse 88, temperature 98.5 F (36.9 C), resp. rate 19, SpO2 99 %.There is no height or weight on file to calculate BMI.  General Appearance: Guarded  Eye Contact:  Poor  Speech:  Clear and Coherent and Slow  Volume:  Decreased  Mood:  Dysphoric  Affect:  Congruent  Thought Process:  Coherent, Linear and Descriptions of Associations: Intact  Orientation:  Full (Time, Place, and Person)  Thought Content:  Logical  Suicidal Thoughts:  No  Homicidal Thoughts:  No  Memory:  Immediate;   Poor Recent;   Poor  Judgement:  Poor  Insight:  Fair  Psychomotor Activity:  Psychomotor Retardation  Concentration:  Concentration: Fair and Attention Span: Fair  Recall:  Fair  Fund of Knowledge:  Fair  Language:  Fair  Akathisia:  No  Handed:  Right  AIMS (if indicated):     Assets:  Communication Skills Leisure Time Physical Health Social Support  ADL's:  Intact  Cognition:  WNL  Sleep:      Patient was resting upon entering the room, however promptly set up and Printmaker.  Patient was irritable, yet agreeably cooperative throughout the assessment.  He was somewhat angry and remained on edge throughout.  I explored the effects of substances on his mental health with him.  Patient has no motivation to stay sober, he is able to verbalize risks and effects of substances family dynamics,legal charges.  Patient is not endorsing suicidal ideation, and is able to contract for safety if he leaves the hospital.  Patient has limited insight on the role of substances in his mental health, and he does not seem motivated to engage in any relapse preventative measure at this time.  We will psychiatrically cleared patient.    Treatment Plan Summary: Plan Will recommend follow up at [facility] and or SW consult for substance abuse treatment. Patient declines any further mental health treatment  at this time,, and continues to have poor insight into his substance abuse history. May discontinue suicide precautions and rescind IVC.  Disposition: No evidence of imminent risk to self or others at present.   Patient does not meet criteria for psychiatric inpatient admission. Supportive therapy provided about ongoing stressors. Refer to IOP. Discussed crisis plan, support from social network, calling 911, coming to the Emergency Department, and calling Suicide Hotline.  Maryagnes Amos, FNP 01/17/2020 3:33 PM

## 2020-01-17 NOTE — Progress Notes (Signed)
PROGRESS NOTE    David Bean  YIR:[id number] DOB: [birth year] DOA: 01/14/2020 PCP: Patient, No Pcp Per   Chief Complaint  Patient presents with  . Altered Mental Status    Brief Narrative:  David Bean is a 44 y.o. male with medical history significant of polysubstance abuse including previous ED visits for methamphetamine use presents the emergency department by EMS. Level 5 caveat due to intoxication and patient cooperation. Per EMS report, patient was seen in the woods behind a business. EMS was called. Patient was acting irregularly, spitting, stating "go ahead and shoot me".    ED Course: Afebrile, VSS. Patient initially uncooperative but over time became more communicative. Lab revealed AKI with Cr 2.23, up from 0;86 09/04/19. IVF started. TRH asked to admit for hydration and psych consult  Assessment & Plan:   Possible suicidal Ideation  Agitation  Polysubstance Abuse:  Pt found in the woods, acting irregularly, saying "go ahead and shoot me".  He was brought into the ED by police.  Head CT unremarkable was IVC'd in the emergency room Now with one-to-one sitter -Required as needed Haldol 2 days ago, eloped on night of admission and had to be forcibly returned to his rooms and placed into 4 point restraints, now off restraints Utox positive for amphetamines and cocaine -He is awake and alert however refuses to communicate with staff, was able to tell me that he wants to be left alone  -Psych consult pending pts willingness to co-operate  Acute Kidney Injury: resolved  Leukocytosis: resolved   DVT prophylaxis: lovenox Code Status: full  Family Communication: none at bedside Disposition:   Status is: Inpatient The patient will require care spanning > 2 midnights and should be moved to inpatient because: Inpatient level of care appropriate due to severity of illness  Dispo: The patient is from: Home              Anticipated d/c is to: Await psych eval               Anticipated d/c date is: Pending psych eval, unfortunately patient refuses to interact with them              Patient currently is not medically stable to d/c.   Consultants:   psychiatry  Procedures:  none  Antimicrobials:  Anti-infectives (From admission, onward)   None     Subjective: -Ate breakfast this morning, ambulating to the bathroom without distress per staff -Wants to go home, wants to be left alone, does not think anyone from psych will be able to help him  Objective: Vitals:   01/15/20 2127 01/16/20 0304 01/16/20 2051 01/17/20 0508  BP: (!) 125/92 (!) 116/91 98/62 (!) 98/59  Pulse: 88 90 70 95  Resp: 20 20 18 18   Temp:   98.2 F (36.8 C) 98.5 F (36.9 C)  TempSrc: Oral     SpO2: 100%  98% 99%    Intake/Output Summary (Last 24 hours) at 01/17/2020 1134 Last data filed at 01/17/2020 0600 Gross per 24 hour  Intake 440 ml  Output 0 ml  Net 440 ml   There were no vitals filed for this visit.  Examination:  General: Awake eyes closed, minimal interaction, CVS: S1-S2, regular rate rhythm Lungs: Clear Abdomen: Soft, nontender Extremities: No edema Psych and neuro, unable to assess, eyes closed, refuses to interact   Data Reviewed: I have personally reviewed following labs and imaging studies  CBC: Recent Labs  Lab 01/14/20 1718 01/15/20  0804  WBC 16.9* 9.6  NEUTROABS 14.3* 6.0  HGB 17.6* 14.2  HCT 51.8 43.0  MCV 92.3 94.9  PLT 271 224    Basic Metabolic Panel: Recent Labs  Lab 01/14/20 1718 01/15/20 0630  NA 141 142  K 4.6 4.1  CL 104 108  CO2 21* 26  GLUCOSE 96 107*  BUN 36* 26*  CREATININE 2.23* 1.10  CALCIUM 9.8 8.8*    GFR: CrCl cannot be calculated (Unknown ideal weight.).  Liver Function Tests: Recent Labs  Lab 01/14/20 1718  AST 20  ALT 20  ALKPHOS 138*  BILITOT 0.9  PROT 8.5*  ALBUMIN 4.7    CBG: Recent Labs  Lab 01/14/20 1719  GLUCAP 93     Recent Results (from the past 240 hour(s))  SARS  Coronavirus 2 by RT PCR (hospital order, performed in [facility] hospital lab) Nasopharyngeal Nasopharyngeal Swab     Status: None   Collection Time: 01/14/20  7:30 PM   Specimen: Nasopharyngeal Swab  Result Value Ref Range Status   SARS Coronavirus 2 NEGATIVE NEGATIVE Final    Comment: (NOTE) SARS-CoV-2 target nucleic acids are NOT DETECTED.  The SARS-CoV-2 RNA is generally detectable in upper and lower respiratory specimens during the acute phase of infection. The lowest concentration of SARS-CoV-2 viral copies this assay can detect is 250 copies / mL. A negative result does not preclude SARS-CoV-2 infection and should not be used as the sole basis for treatment or other patient management decisions.  A negative result may occur with improper specimen collection / handling, submission of specimen other than nasopharyngeal swab, presence of viral mutation(s) within the areas targeted by this assay, and inadequate number of viral copies (<250 copies / mL). A negative result must be combined with clinical observations, patient history, and epidemiological information.  Fact Sheet for Patients:   BoilerBrush.com.cy  Fact Sheet for Healthcare Providers: https://pope.com/  This test is not yet approved or  cleared by the [place] FDA and has been authorized for detection and/or diagnosis of SARS-CoV-2 by FDA under an Emergency Use Authorization (EUA).  This EUA will remain in effect (meaning this test can be used) for the duration of the COVID-19 declaration under Section 564(b)(1) of the Act, 21 U.S.C. section 360bbb-3(b)(1), unless the authorization is terminated or revoked sooner.  Performed at [facility], [address] [place], [place] [address]          Radiology Studies: No results found.      Scheduled Meds: . enoxaparin (LOVENOX) injection  40 mg Subcutaneous QHS  . hydrocortisone cream  1  application Topical BID   Continuous Infusions:    LOS: 2 days    Time spent:  Zannie Cove, MD [facility]   01/17/2020, 11:34 AM

## 2020-01-17 NOTE — Plan of Care (Signed)
  Problem: Education: Goal: Knowledge of disease and its progression will improve Outcome: Not Progressing   Problem: Health Behavior/Discharge Planning: Goal: Ability to manage health-related needs will improve Outcome: Not Progressing   Problem: Clinical Measurements: Goal: Complications related to the disease process or treatment will be avoided or minimized Outcome: Not Progressing Goal: Dialysis access will remain free of complications Outcome: Not Progressing   Problem: Activity: Goal: Activity intolerance will improve Outcome: Not Progressing   Problem: Fluid Volume: Goal: Fluid volume balance will be maintained or improved Outcome: Not Progressing   Problem: Nutritional: Goal: Ability to make appropriate dietary choices will improve Outcome: Not Progressing

## 2020-01-18 ENCOUNTER — Encounter (HOSPITAL_COMMUNITY): Payer: Self-pay | Admitting: Family Medicine

## 2020-01-18 ENCOUNTER — Other Ambulatory Visit: Payer: Self-pay

## 2020-01-18 NOTE — Progress Notes (Signed)
DISCHARGE NOTE HOME David Bean to be discharged Home per MD order. Discussed prescriptions and follow up appointments with the patient. Prescriptions given to patient; medication list explained in detail. Patient verbalized understanding.  Skin clean, dry and intact without evidence of skin break down, no evidence of skin tears noted. IV catheter discontinued intact. Site without signs and symptoms of complications. Dressing and pressure applied. Pt denies pain at the site currently. No complaints noted.  Patient free of lines, drains, and wounds.   An After Visit Summary (AVS) was printed and given to the patient. Patient escorted via wheelchair, and discharged home via private auto.  Lorine Bears, RN

## 2020-01-18 NOTE — Discharge Summary (Signed)
Physician Discharge Summary  David Bean:[id number] DOB: [birth year] DOA: 01/14/2020  PCP: Patient, No Pcp Per  Admit date: 01/14/2020 Discharge date: 01/18/2020  Time spent: 35 minutes  Recommendations for Outpatient Follow-up:  1. Outpatient psychiatry, resources given   Discharge Diagnoses:  Active Problems:   Polysubstance abuse (HCC)   Acute kidney injury (HCC)   Encephalopathy   Agitation   Discharge Condition: Stable  Diet recommendation: Regular  Filed Weights   01/18/20 0150  Weight: 71.7 kg    History of present illness:  David Boquet Mooreis a 43 y.o.malewith medical history significant ofpolysubstance abuse including previous ED visits for methamphetamine use presents the emergency department by EMS. Level 5 caveat due to intoxication and patient cooperation. Per EMS report, patient was seen in the woods behind a business. EMS was called. Patient was actingirrationally, spitting, stating "go ahead and shoot me".  ED Course:Afebrile, VSS. Patient initially uncooperative but over time became more communicative. Lab revealed AKI with Cr 2.23, up from 0;86 09/04/19. IVF started. TRH asked to admit for hydration and psych consult  Hospital Course:   Encephalopathy  Agitation  Polysubstance Abuse:  Pt found in the woods, acting irrationally, saying "go ahead and shoot me".  He was brought into the ED by police.  Head CT unremarkable was IVC'd in the emergency room -Required as needed Haldol 4 days ago, eloped on night of admission and had to be forcibly returned to his rooms and placed into 4 point restraints, now off restraints Utox positive for amphetamines and cocaine -He is awake and alert however for the last few days refused to communicate with staff or psychiatry, finally was able to communicate with psych MD yesterday, recommendations were made for outpatient psych follow-up and social work consult for substance abuse  -Resources were given to the  patient today by social work  -Follow-up made in [place], counseled and discharged home in a stable condition   Acute Kidney Injury: -Dehydration, resolved  Leukocytosis: resolved   Discharge Exam: Vitals:   01/18/20 0559 01/18/20 0954  BP: 106/65 110/78  Pulse: (!) 53 (!) 56  Resp: 16 15  Temp: 98.6 F (37 C)   SpO2: 100% 97%    General: Awake alert oriented x3, flat affect Cardiovascular: S1-S2, regular rate rhythm Respiratory: Clear  Discharge Instructions   Discharge Instructions    Diet - low sodium heart healthy   Complete by: As directed    Increase activity slowly   Complete by: As directed      Allergies as of 01/18/2020      Reactions   Aspirin Anaphylaxis      Medication List    STOP taking these medications   lidocaine 5 % ointment Commonly known as: XYLOCAINE     TAKE these medications   hydrocortisone cream 1 % Commonly known as: Preparation H Apply 1 application topically 2 (two) times daily.      Allergies  Allergen Reactions  . Aspirin Anaphylaxis      The results of significant diagnostics from this hospitalization (including imaging, microbiology, ancillary and laboratory) are listed below for reference.    Significant Diagnostic Studies: CT Head Wo Contrast  Result Date: 01/14/2020 CLINICAL DATA:  44 year old male with altered mental status. EXAM: CT HEAD WITHOUT CONTRAST TECHNIQUE: Contiguous axial images were obtained from the base of the skull through the vertex without intravenous contrast. COMPARISON:  Head CT dated 09/04/2019. FINDINGS: Brain: The ventricles and sulci are appropriate size for patient's age. The gray-white  matter discrimination is preserved. There is no acute intracranial hemorrhage. No mass effect or midline shift. No extra-axial fluid collection. Vascular: No hyperdense vessel or unexpected calcification. Skull: Normal. Negative for fracture or focal lesion. Sinuses/Orbits: There is a 12 mm right sphenoid  sinus retention cyst or polyp. The remainder of the visualized paranasal sinuses and mastoid air cells are clear. No air-fluid level. There is deviation of the nasal septum to the left. Other: None IMPRESSION: Unremarkable noncontrast CT of the brain. Electronically Signed   By: Elgie Collard M.D.   On: 01/14/2020 19:11    Microbiology: Recent Results (from the past 240 hour(s))  SARS Coronavirus 2 by RT PCR (hospital order, performed in [facility] hospital lab) Nasopharyngeal Nasopharyngeal Swab     Status: None   Collection Time: 01/14/20  7:30 PM   Specimen: Nasopharyngeal Swab  Result Value Ref Range Status   SARS Coronavirus 2 NEGATIVE NEGATIVE Final    Comment: (NOTE) SARS-CoV-2 target nucleic acids are NOT DETECTED.  The SARS-CoV-2 RNA is generally detectable in upper and lower respiratory specimens during the acute phase of infection. The lowest concentration of SARS-CoV-2 viral copies this assay can detect is 250 copies / mL. A negative result does not preclude SARS-CoV-2 infection and should not be used as the sole basis for treatment or other patient management decisions.  A negative result may occur with improper specimen collection / handling, submission of specimen other than nasopharyngeal swab, presence of viral mutation(s) within the areas targeted by this assay, and inadequate number of viral copies (<250 copies / mL). A negative result must be combined with clinical observations, patient history, and epidemiological information.  Fact Sheet for Patients:   BoilerBrush.com.cy  Fact Sheet for Healthcare Providers: https://pope.com/  This test is not yet approved or  cleared by the [place] FDA and has been authorized for detection and/or diagnosis of SARS-CoV-2 by FDA under an Emergency Use Authorization (EUA).  This EUA will remain in effect (meaning this test can be used) for the duration of the COVID-19  declaration under Section 564(b)(1) of the Act, 21 U.S.C. section 360bbb-3(b)(1), unless the authorization is terminated or revoked sooner.  Performed at [facility], [address] [place], [place] [address]      Labs: Basic Metabolic Panel: Recent Labs  Lab 01/14/20 1718 01/15/20 0630  NA 141 142  K 4.6 4.1  CL 104 108  CO2 21* 26  GLUCOSE 96 107*  BUN 36* 26*  CREATININE 2.23* 1.10  CALCIUM 9.8 8.8*   Liver Function Tests: Recent Labs  Lab 01/14/20 1718  AST 20  ALT 20  ALKPHOS 138*  BILITOT 0.9  PROT 8.5*  ALBUMIN 4.7   No results for input(s): LIPASE, AMYLASE in the last 168 hours. No results for input(s): AMMONIA in the last 168 hours. CBC: Recent Labs  Lab 01/14/20 1718 01/15/20 0804  WBC 16.9* 9.6  NEUTROABS 14.3* 6.0  HGB 17.6* 14.2  HCT 51.8 43.0  MCV 92.3 94.9  PLT 271 224   Cardiac Enzymes: No results for input(s): CKTOTAL, CKMB, CKMBINDEX, TROPONINI in the last 168 hours. BNP: BNP (last 3 results) No results for input(s): BNP in the last 8760 hours.  ProBNP (last 3 results) No results for input(s): PROBNP in the last 8760 hours.  CBG: Recent Labs  Lab 01/14/20 1719  GLUCAP 93       Signed:  Zannie Cove MD.  [facility] 01/18/2020, 10:44 AM

## 2020-01-18 NOTE — TOC Transition Note (Signed)
Transition of Care [facility]) - CM/SW Discharge Note   Patient Details  Name: David Bean MRN: [id number] Date of Birth: 1976-02-20  Transition of Care [facility]) CM/SW Contact:  Cristobal Goldmann, LCSW Phone Number: 01/18/2020, 12:42 PM   Clinical Narrative: Patient was seen by psychiatry on 7.8 and determined that patient no longer in need of inpatient psychiatric placement, and he is no longer a danger to himself or others. Notice of Commitment Change form completed, signed by MD and faxed to [facility] [employer]. Transportation home ([address], [place], [place]) arranged with [employer].  [name] has appointments scheduled with [facility] for his mental health needs and [facility].  [name] was advised of these appointments and the information is on his discharge paperwork.     Final next level of care: Home/Self Care Barriers to Discharge: Barriers Resolved   Patient Goals and CMS Choice Patient states their goals for this hospitalization and ongoing recovery are:: Patient returning home with mental health and medical appointments scheduled   Choice offered to / list presented to : NA  Discharge Placement                Patient to be transferred to facility by: Patient discharging home Name of family member notified: No family notified Patient and family notified of of transfer: 01/18/20  Discharge Plan and Services                                     Social Determinants of Health (SDOH) Interventions  Patient has appointment scheduled for his mental health needs   Readmission Risk Interventions No flowsheet data found.

## 2021-03-12 IMAGING — CT CT HEAD W/O CM
3 of 4 series · 15 of 47 positions shown, 18 images · non-contrast
Comparison: Head CT dated 09/04/2019.

CLINICAL DATA: 43-year-old male with altered mental status.

EXAM:
CT HEAD WITHOUT CONTRAST
TECHNIQUE: Contiguous axial images were obtained from the base of the skull
through the vertex without intravenous contrast.

[Series 3: head 5.0 h30s · axial · 0.45mm/px · z∈[-79,+51]mm · 9 of 32 slices shown, 12 images]
[im 3/32  brain]
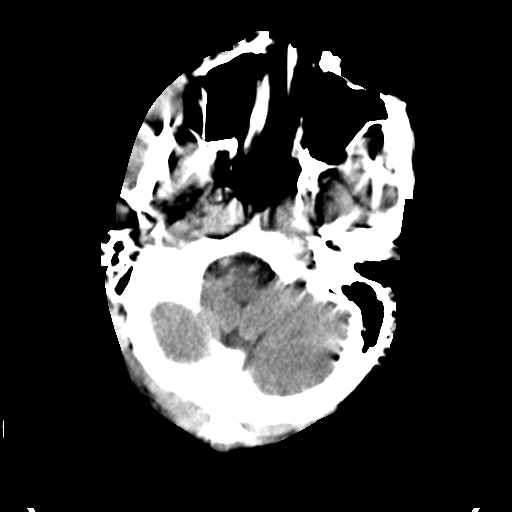
[im 3/32  bone]
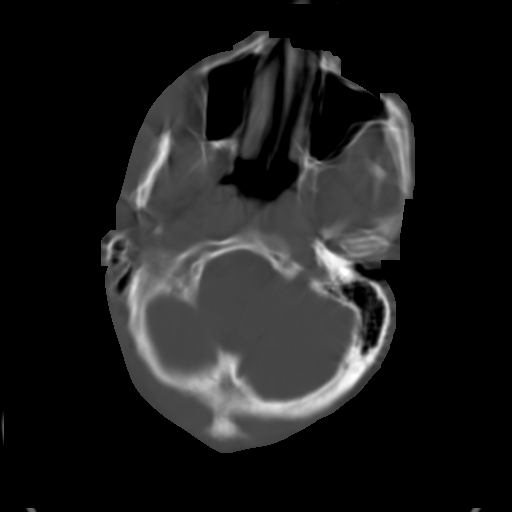
[im 7/32  brain]
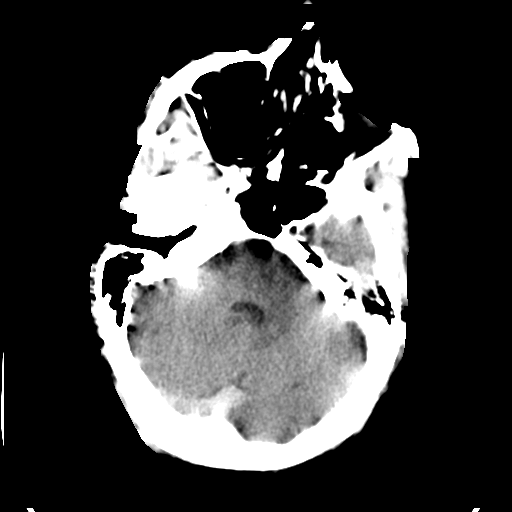
[im 9/32  brain]
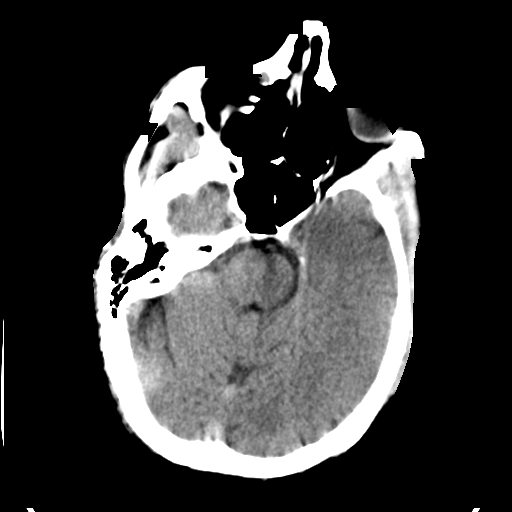
[im 14/32  brain]
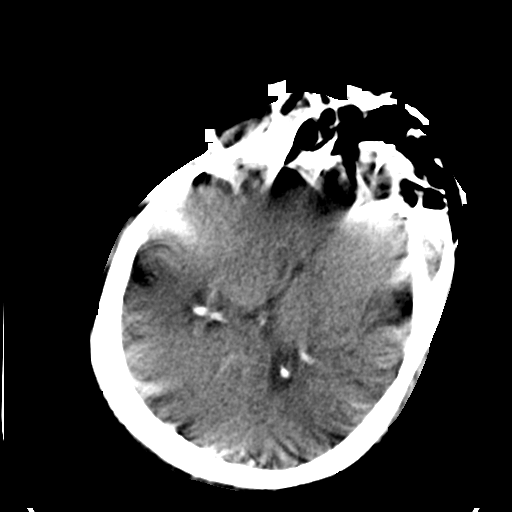
[im 16/32  brain]
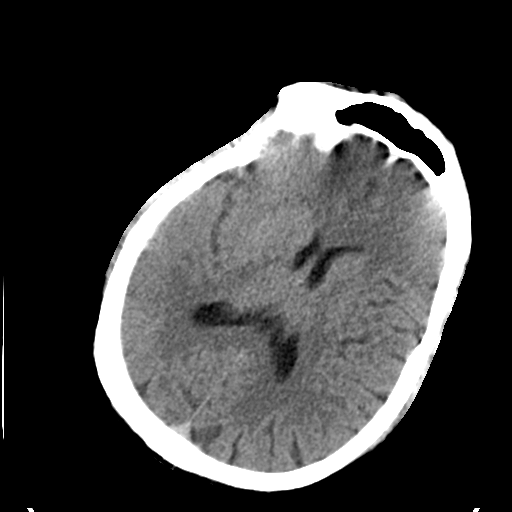
[im 16/32  bone]
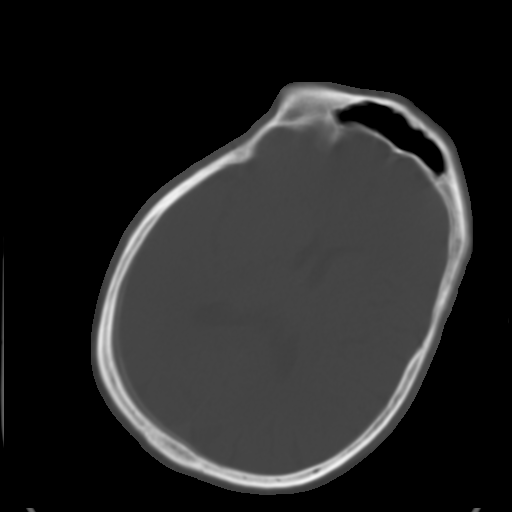
[im 18/32  brain]
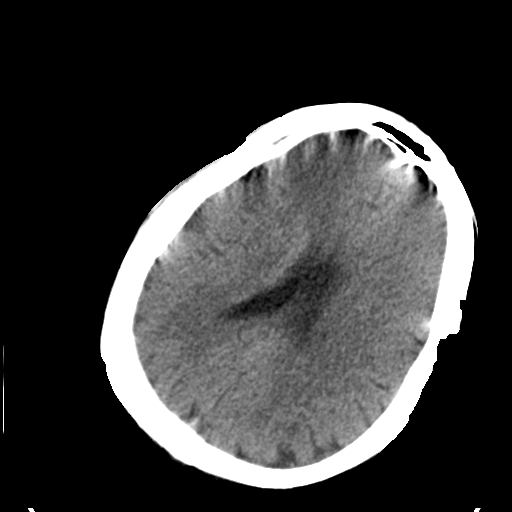
[im 23/32  brain]
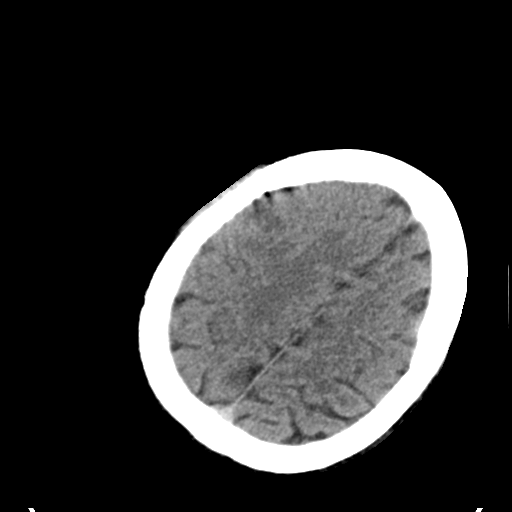
[im 25/32  brain]
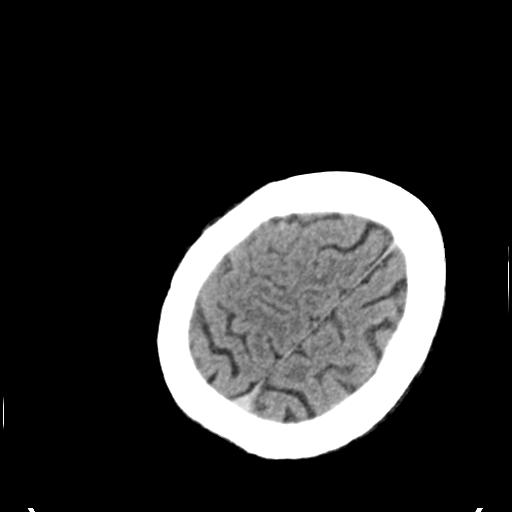
[im 29/32  brain]
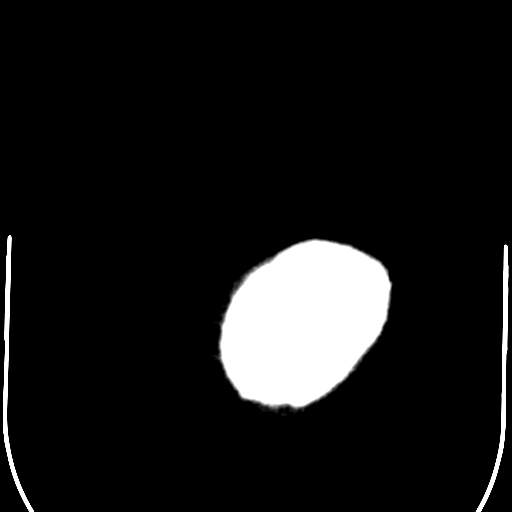
[im 29/32  bone]
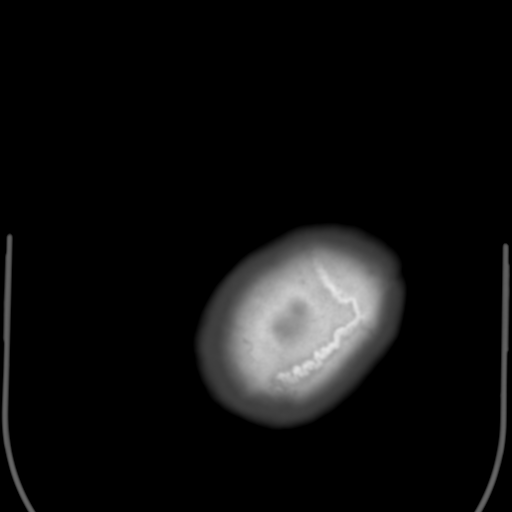

[Series 6: head 3.0 mpr cor · coronal · 0.32mm/px · 3 of 74 slices shown]
[im 25/74  brain]
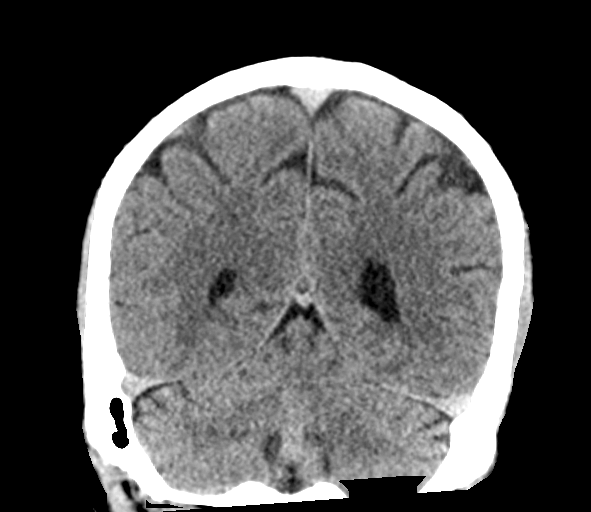
[im 33/74  brain]
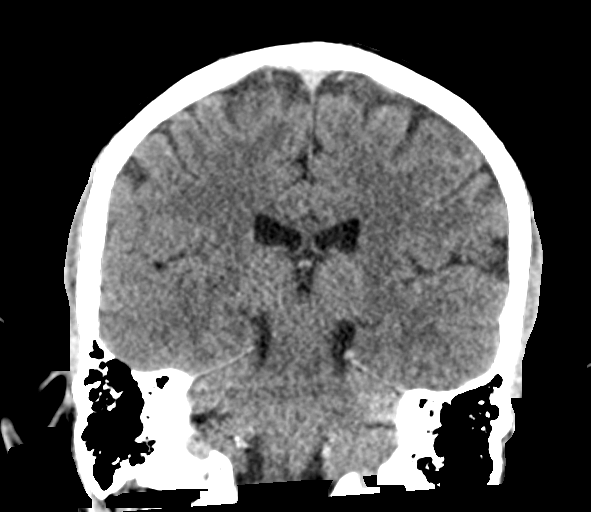
[im 41/74  brain]
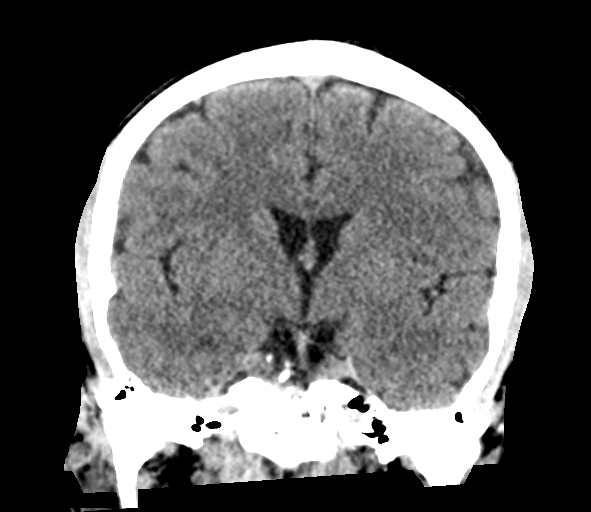

[Series 7: head 3.0 mpr sag · sagittal · 0.30mm/px · 3 of 65 slices shown]
[im 22/65  brain]
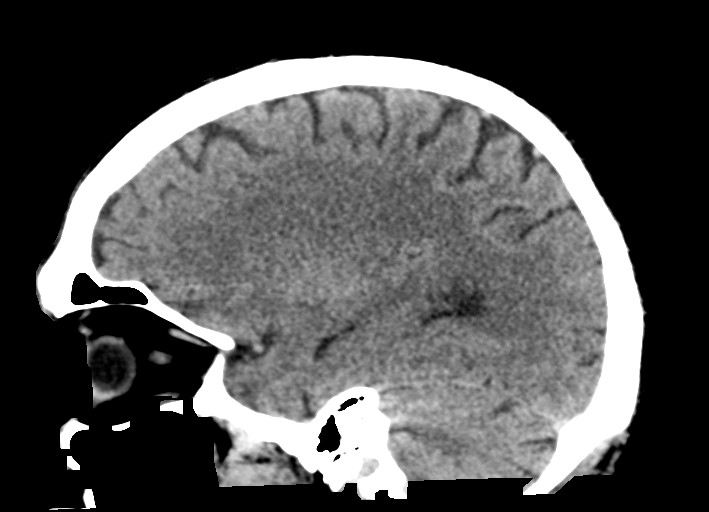
[im 33/65  brain]
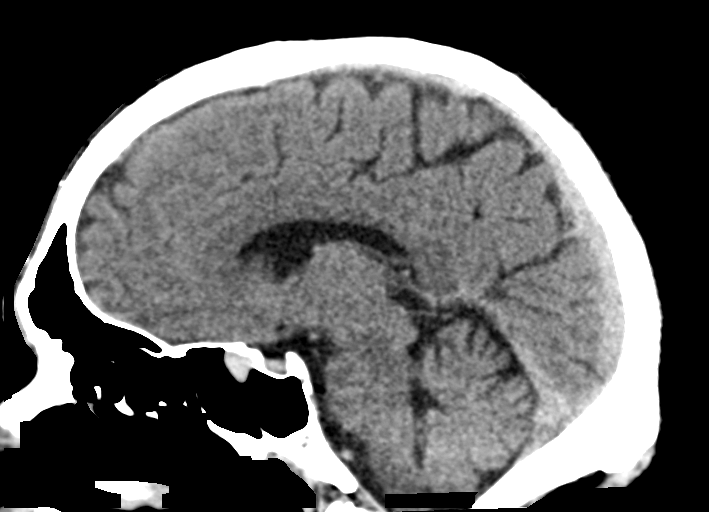
[im 43/65  brain]
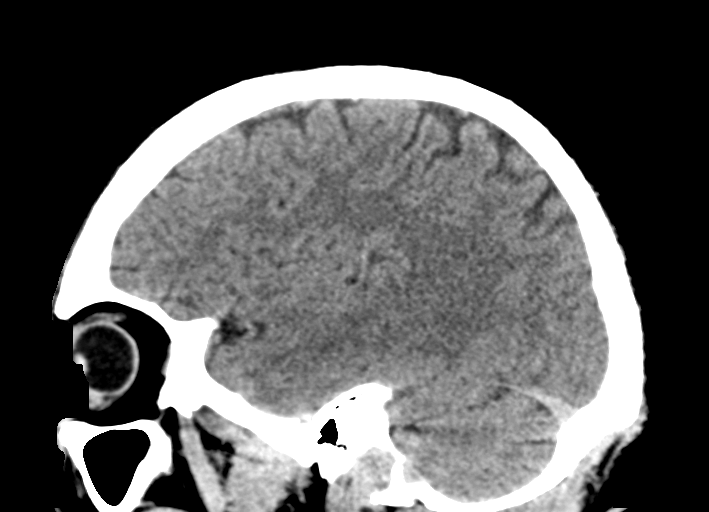

[15 of 47 positions shown; findings below may reference images not displayed]

FINDINGS: Brain: The ventricles and sulci are appropriate size for patient's
age. The gray-white matter discrimination is preserved. There is no
acute intracranial hemorrhage. No mass effect or midline shift. No
extra-axial fluid collection.

Vascular: No hyperdense vessel or unexpected calcification.

Skull: Normal. Negative for fracture or focal lesion.

Sinuses/Orbits: There is a 12 mm right sphenoid sinus retention cyst
or polyp. The remainder of the visualized paranasal sinuses and
mastoid air cells are clear. No air-fluid level. There is deviation
of the nasal septum to the left.

Other: None
IMPRESSION: Unremarkable noncontrast CT of the brain.

## 2022-01-28 ENCOUNTER — Emergency Department: Payer: Self-pay

## 2022-01-28 ENCOUNTER — Other Ambulatory Visit: Payer: Self-pay

## 2022-01-28 ENCOUNTER — Inpatient Hospital Stay
Admission: EM | Admit: 2022-01-28 | Discharge: 2022-01-29 | DRG: 502 | Discharge status: Against Medical Advice | Payer: Self-pay | Attending: Obstetrics and Gynecology | Admitting: Obstetrics and Gynecology

## 2022-01-28 DIAGNOSIS — F1510 Other stimulant abuse, uncomplicated: Secondary | ICD-10-CM | POA: Diagnosis present

## 2022-01-28 DIAGNOSIS — Z5321 Procedure and treatment not carried out due to patient leaving prior to being seen by health care provider: Secondary | ICD-10-CM | POA: Diagnosis present

## 2022-01-28 DIAGNOSIS — L03114 Cellulitis of left upper limb: Secondary | ICD-10-CM

## 2022-01-28 DIAGNOSIS — Z20822 Contact with and (suspected) exposure to covid-19: Secondary | ICD-10-CM | POA: Diagnosis present

## 2022-01-28 DIAGNOSIS — F1910 Other psychoactive substance abuse, uncomplicated: Secondary | ICD-10-CM | POA: Diagnosis present

## 2022-01-28 DIAGNOSIS — L02414 Cutaneous abscess of left upper limb: Secondary | ICD-10-CM | POA: Diagnosis present

## 2022-01-28 DIAGNOSIS — A419 Sepsis, unspecified organism: Secondary | ICD-10-CM | POA: Diagnosis present

## 2022-01-28 DIAGNOSIS — F1990 Other psychoactive substance use, unspecified, uncomplicated: Secondary | ICD-10-CM

## 2022-01-28 DIAGNOSIS — F17210 Nicotine dependence, cigarettes, uncomplicated: Secondary | ICD-10-CM | POA: Diagnosis present

## 2022-01-28 DIAGNOSIS — Z886 Allergy status to analgesic agent status: Secondary | ICD-10-CM

## 2022-01-28 DIAGNOSIS — L0291 Cutaneous abscess, unspecified: Principal | ICD-10-CM

## 2022-01-28 DIAGNOSIS — Z23 Encounter for immunization: Secondary | ICD-10-CM

## 2022-01-28 DIAGNOSIS — L0390 Cellulitis, unspecified: Secondary | ICD-10-CM | POA: Insufficient documentation

## 2022-01-28 DIAGNOSIS — Z5900 Homelessness unspecified: Secondary | ICD-10-CM

## 2022-01-28 DIAGNOSIS — M60032 Infective myositis, left forearm: Principal | ICD-10-CM | POA: Diagnosis present

## 2022-01-28 LAB — COMPREHENSIVE METABOLIC PANEL
ALT: 18 U/L (ref 0–44)
AST: 17 U/L (ref 15–41)
Albumin: 3.2 g/dL — ABNORMAL LOW (ref 3.5–5.0)
Alkaline Phosphatase: 87 U/L (ref 38–126)
Anion gap: 7 (ref 5–15)
BUN: 10 mg/dL (ref 6–20)
CO2: 28 mmol/L (ref 22–32)
Calcium: 8.2 mg/dL — ABNORMAL LOW (ref 8.9–10.3)
Chloride: 101 mmol/L (ref 98–111)
Creatinine, Ser: 0.79 mg/dL (ref 0.61–1.24)
GFR, Estimated: >60 mL/min (ref >60)
Glucose, Bld: 91 mg/dL (ref 70–99)
Potassium: 3.7 mmol/L (ref 3.5–5.1)
Sodium: 136 mmol/L (ref 135–145)
Total Bilirubin: 0.5 mg/dL (ref 0.3–1.2)
Total Protein: 7 g/dL (ref 6.5–8.1)

## 2022-01-28 LAB — CBC WITH DIFFERENTIAL/PLATELET
Abs Immature Granulocytes: 0.04 10*3/uL (ref 0.00–0.07)
Basophils Absolute: 0 10*3/uL (ref 0.0–0.1)
Basophils Relative: 0 %
Eosinophils Absolute: 0.1 10*3/uL (ref 0.0–0.5)
Eosinophils Relative: 1 %
HCT: 38.7 % — ABNORMAL LOW (ref 39.0–52.0)
Hemoglobin: 12.7 g/dL — ABNORMAL LOW (ref 13.0–17.0)
Immature Granulocytes: 0 %
Lymphocytes Relative: 10 %
Lymphs Abs: 1.2 10*3/uL (ref 0.7–4.0)
MCH: 30.5 pg (ref 26.0–34.0)
MCHC: 32.8 g/dL (ref 30.0–36.0)
MCV: 92.8 fL (ref 80.0–100.0)
Monocytes Absolute: 1.2 10*3/uL — ABNORMAL HIGH (ref 0.1–1.0)
Monocytes Relative: 11 %
Neutro Abs: 9.1 10*3/uL — ABNORMAL HIGH (ref 1.7–7.7)
Neutrophils Relative %: 78 %
Platelets: 225 10*3/uL (ref 150–400)
RBC: 4.17 MIL/uL — ABNORMAL LOW (ref 4.22–5.81)
RDW: 12.7 % (ref 11.5–15.5)
WBC: 11.7 10*3/uL — ABNORMAL HIGH (ref 4.0–10.5)
nRBC: 0 % (ref 0.0–0.2)

## 2022-01-28 LAB — TROPONIN I (HIGH SENSITIVITY)
Troponin I (High Sensitivity): 5 ng/L (ref <18)
Troponin I (High Sensitivity): 6 ng/L (ref <18)

## 2022-01-28 LAB — LACTIC ACID, PLASMA
Lactic Acid, Venous: 2.7 mmol/L (ref 0.5–1.9)
Lactic Acid, Venous: 1.6 mmol/L (ref 0.5–1.9)

## 2022-01-28 LAB — MRSA NEXT GEN BY PCR, NASAL: MRSA by PCR Next Gen: DETECTED — AB

## 2022-01-28 LAB — SARS CORONAVIRUS 2 BY RT PCR: SARS Coronavirus 2 by RT PCR: NEGATIVE

## 2022-01-28 MED ORDER — MORPHINE SULFATE (PF) 2 MG/ML IV SOLN
2.0000 mg | INTRAVENOUS | Status: DC | PRN
  Administered 2022-01-29: 2 mg via INTRAVENOUS
  Filled 2022-01-28: qty 1

## 2022-01-28 MED ORDER — ONDANSETRON HCL 4 MG PO TABS: 4.0000 mg | ORAL_TABLET | Freq: Four times a day (QID) | ORAL | Status: DC | PRN

## 2022-01-28 MED ORDER — IOHEXOL 300 MG/ML  SOLN
80.0000 mL | Freq: Once | INTRAMUSCULAR | Status: AC | PRN
  Administered 2022-01-28: 80 mL via INTRAVENOUS

## 2022-01-28 MED ORDER — SODIUM CHLORIDE 0.9 % IV SOLN
2.0000 g | Freq: Three times a day (TID) | INTRAVENOUS | Status: DC
  Administered 2022-01-29: 2 g via INTRAVENOUS
  Filled 2022-01-28: qty 12.5
  Filled 2022-01-28: qty 2

## 2022-01-28 MED ORDER — ACETAMINOPHEN 325 MG PO TABS: 650.0000 mg | ORAL_TABLET | Freq: Four times a day (QID) | ORAL | Status: DC | PRN

## 2022-01-28 MED ORDER — ONDANSETRON HCL 4 MG/2ML IJ SOLN: 4.0000 mg | Freq: Four times a day (QID) | INTRAMUSCULAR | Status: DC | PRN

## 2022-01-28 MED ORDER — ACETAMINOPHEN 650 MG RE SUPP: 650.0000 mg | Freq: Four times a day (QID) | RECTAL | Status: DC | PRN

## 2022-01-28 MED ORDER — CEFEPIME HCL 2 G IV SOLR
2.0000 g | Freq: Once | INTRAVENOUS | Status: AC
  Administered 2022-01-28: 2 g via INTRAVENOUS
  Filled 2022-01-28: qty 12.5

## 2022-01-28 MED ORDER — KETOROLAC TROMETHAMINE 30 MG/ML IJ SOLN
15.0000 mg | Freq: Once | INTRAMUSCULAR | Status: AC
  Administered 2022-01-28: 15 mg via INTRAVENOUS
  Filled 2022-01-28: qty 1

## 2022-01-28 MED ORDER — THIAMINE HCL 100 MG PO TABS: 100.0000 mg | ORAL_TABLET | Freq: Every day | ORAL | Status: DC

## 2022-01-28 MED ORDER — SODIUM CHLORIDE 0.9% FLUSH
3.0000 mL | Freq: Two times a day (BID) | INTRAVENOUS | Status: DC
  Administered 2022-01-28 – 2022-01-29 (×2): 3 mL via INTRAVENOUS

## 2022-01-28 MED ORDER — SODIUM CHLORIDE 0.9 % IV SOLN: INTRAVENOUS | Status: DC

## 2022-01-28 MED ORDER — HYDROCODONE-ACETAMINOPHEN 5-325 MG PO TABS
1.0000 | ORAL_TABLET | ORAL | Status: DC | PRN
  Administered 2022-01-29: 1 via ORAL
  Filled 2022-01-28: qty 1

## 2022-01-28 MED ORDER — LACTATED RINGERS IV BOLUS
1000.0000 mL | Freq: Once | INTRAVENOUS | Status: AC
  Administered 2022-01-28: 1000 mL via INTRAVENOUS

## 2022-01-28 MED ORDER — VANCOMYCIN HCL 2000 MG/400ML IV SOLN
2000.0000 mg | Freq: Once | INTRAVENOUS | Status: AC
  Administered 2022-01-28: 2000 mg via INTRAVENOUS
  Filled 2022-01-28: qty 400

## 2022-01-28 MED ORDER — MORPHINE SULFATE (PF) 4 MG/ML IV SOLN
4.0000 mg | Freq: Once | INTRAVENOUS | Status: AC
  Administered 2022-01-28: 4 mg via INTRAVENOUS
  Filled 2022-01-28: qty 1

## 2022-01-28 MED ORDER — NICOTINE 21 MG/24HR TD PT24: 21.0000 mg | MEDICATED_PATCH | Freq: Every day | TRANSDERMAL | Status: DC

## 2022-01-28 NOTE — Assessment & Plan Note (Addendum)
Psych consult  per am team.  counseling once pt is settles and out of pain.  EKG/ UDS.

## 2022-01-28 NOTE — ED Triage Notes (Signed)
Pt states his L arm has been swollen and painful for 4-5 days- pt is an IV drug user- pt states it started in his arm and then travelled to the hand

## 2022-01-28 NOTE — ED Provider Notes (Signed)
[facility] Provider Note    Event Date/Time   First MD Initiated Contact with Patient 01/28/22 1715     (approximate)   History   Arm Pain   HPI  David Bean is a 46 y.o. male with a past medical history of polysubstance abuse patient endorsing IV meth use most recently 5 days ago presents for evaluation of 5 days of worsening pain redness and swelling throughout his left arm.  He states he is not sure when he last injected into it.  Does not recall any injuries.  States he has had a slight cough and subjective fevers.  No shortness of breath, chest pain, back pain or any other areas of redness pain or swelling.  No prior similar episodes.  He denies any other acute concerns at this time.    Past Medical History:  Diagnosis Date   Facial bones, closed fracture (HCC) 09/20/2019   broken nose   Polysubstance abuse [facility])      Physical Exam  Triage Vital Signs: ED Triage Vitals  Enc Vitals Group     BP 01/28/22 1552 121/85     Pulse Rate 01/28/22 1552 91     Resp 01/28/22 1552 16     Temp 01/28/22 1552 99.8 F (37.7 C)     Temp Source 01/28/22 1552 Oral     SpO2 01/28/22 1552 100 %     Weight 01/28/22 1550 175 lb (79.4 kg)     Height 01/28/22 1550 5\' 9"  (1.753 m)     Head Circumference --      Peak Flow --      Pain Score 01/28/22 1550 7     Pain Loc --      Pain Edu? --      Excl. in GC? --     Most recent vital signs: Vitals:   01/28/22 1552 01/28/22 1845  BP: 121/85 113/68  Pulse: 91 77  Resp: 16 18  Temp: 99.8 F (37.7 C)   SpO2: 100% 99%    General: Awake, very uncomfortable appearing. CV:  Good peripheral perfusion.  2+ radial pulse. Resp:  Normal effort.  There are bilaterally. Abd:  No distention.  Other:  Left upper extremity is fairly diffusely erythematous swollen and tender with some induration around the forearm and anterior AC area of the elbow.  He is able to move his fingers and sensation is intact to light touch  throughout.  No clear obvious focal palpable abscess at this time.   ED Results / Procedures / Treatments  Labs (all labs ordered are listed, but only abnormal results are displayed) Labs Reviewed  COMPREHENSIVE METABOLIC PANEL - Abnormal; Notable for the following components:      Result Value   Calcium 8.2 (*)    Albumin 3.2 (*)    All other components within normal limits  CBC WITH DIFFERENTIAL/PLATELET - Abnormal; Notable for the following components:   WBC 11.7 (*)    RBC 4.17 (*)    Hemoglobin 12.7 (*)    HCT 38.7 (*)    Neutro Abs 9.1 (*)    Monocytes Absolute 1.2 (*)    All other components within normal limits  LACTIC ACID, PLASMA - Abnormal; Notable for the following components:   Lactic Acid, Venous 2.7 (*)    All other components within normal limits  CULTURE, BLOOD (ROUTINE X 2)  CULTURE, BLOOD (ROUTINE X 2)  SARS CORONAVIRUS 2 BY RT PCR  MRSA NEXT GEN  BY PCR, NASAL  LACTIC ACID, PLASMA  HIV ANTIBODY (ROUTINE TESTING W REFLEX)  HEPATITIS PANEL, ACUTE  URINE DRUG SCREEN, QUALITATIVE ([facility] ONLY)  TROPONIN I (HIGH SENSITIVITY)  TROPONIN I (HIGH SENSITIVITY)     EKG  EKG is marked for sinus rhythm with ventricular rate of 73, normal axis, without clear evidence of acute ischemia or significant arrhythmia.   RADIOLOGY Chest reviewed by myself shows no focal consoidation, effusion, edema, pneumothorax or other clear acute thoracic process. I also reviewed radiology interpretation and agree with findings described.  Ultrasound of the left upper extremity interpretation without evidence of a DVT.  Also reviewed radiology's interpretation.  CT of the left upper extremity on my interpretation shows a large forearm abscess with surrounding skin changes concerning for cellulitis.  I also reviewed radiology interpretation and agree to findings of pyomyositis with abscess.  PROCEDURES:  Critical Care performed: No  .1-3 Lead EKG Interpretation  Performed by:  Lucrezia Starch, MD Authorized by: Lucrezia Starch, MD     Interpretation: normal     ECG rate assessment: normal     Rhythm: sinus rhythm     Ectopy: none     Conduction: normal     The patient is on the cardiac monitor to evaluate for evidence of arrhythmia and/or significant heart rate changes.   MEDICATIONS ORDERED IN ED: Medications  vancomycin (VANCOREADY) IVPB 2000 mg/400 mL (2,000 mg Intravenous New Bag/Given 01/28/22 1854)  morphine (PF) 4 MG/ML injection 4 mg (4 mg Intravenous Given 01/28/22 1739)  lactated ringers bolus 1,000 mL (1,000 mLs Intravenous New Bag/Given 01/28/22 1854)  ketorolac (TORADOL) 30 MG/ML injection 15 mg (15 mg Intravenous Given 01/28/22 1733)  iohexol (OMNIPAQUE) 300 MG/ML solution 80 mL (80 mLs Intravenous Contrast Given 01/28/22 1750)  ceFEPIme (MAXIPIME) 2 g in sodium chloride 0.9 % 100 mL IVPB (0 g Intravenous Stopped 01/28/22 1859)     IMPRESSION / MDM / ASSESSMENT AND PLAN / ED COURSE  I reviewed the triage vital signs and the nursing notes. Patient's presentation is most consistent with acute presentation with potential threat to life or bodily function.                               Differential diagnosis includes, but is not limited to abscess, cellulitis, and DVT.  No history of any recent trauma or obvious deformity.  She reportedly reported some chest pain in triage but he is denying this at this examiner.  Troponin not suggestive of ACS.  Chest reviewed by myself shows no focal consoidation, effusion, edema, pneumothorax or other clear acute thoracic process. I also reviewed radiology interpretation and agree with findings described.  Ultrasound of the left upper extremity interpretation without evidence of a DVT.  Also reviewed radiology's interpretation.  CT of the left upper extremity on my interpretation shows a large forearm abscess with surrounding skin changes concerning for cellulitis.  I also reviewed radiology interpretation and  agree to findings of pyomyositis with abscess.  CMP shows no significant electrolyte or metabolic derangements.  CBC shows WBC count of 11.7, hemoglobin of 12.7 and platelets of 225.  Initial lactic acid is 2.7.  Given concern for abscess discussed with on-call orthopedist and spoke with Dr. Mack Guise recommended hospitalist admission IV antibiotics and making patient n.p.o. at midnight for possible operative intervention tomorrow.  I discussed with hospitalist will place admission orders.      FINAL CLINICAL IMPRESSION(S) /  ED DIAGNOSES   Final diagnoses:  Abscess  Cellulitis of left upper extremity  IVDU (intravenous drug user)     Rx / DC Orders   ED Discharge Orders     None        Note:  This document was prepared using Dragon voice recognition software and may include unintentional dictation errors.   Gilles Chiquito, MD 01/28/22 [phone]

## 2022-01-28 NOTE — Assessment & Plan Note (Addendum)
    .   CT of the left hand/ left forearm and left humerus: IMPRESSION: 1. Pyomyositis involving the flexor carpi radialis and flexor digitorum superficialis muscles with a 6.7 x 3.8 x 8.5 cm intramuscular abscess. 2. Soft tissue edema in the subcutaneous fat involving the volar aspect of the upper arm, circumferentially involving the forearm and dorsal aspect of the hand most concerning for cellulitis. Marland Kitchen [place] of LUE neg for DVT. We will continue patient on vancomycin and cefepime along with pharmacy consult. Orthopedics consulted n.p.o. after midnight for anticipated I&D. MIVF. Pain control.  Neuro vascular checks for next 24 hours.

## 2022-01-28 NOTE — Consult Note (Signed)
Pharmacy Antibiotic Note  David Bean is a 46 y.o. male admitted on 01/28/2022 with cellulitis.  Pharmacy has been consulted for cefepime dosing.  Plan: Cefepime 2g IV every 8 hours  Height: 5\' 9"  (175.3 cm) Weight: 79.4 kg (175 lb) IBW/kg (Calculated) : 70.7  Temp (24hrs), Avg:99.8 F (37.7 C), Min:99.8 F (37.7 C), Max:99.8 F (37.7 C)  Recent Labs  Lab 01/28/22 1610 01/28/22 1621 01/28/22 1752  WBC 11.7*  --   --   CREATININE 0.79  --   --   LATICACIDVEN  --  2.7* 1.6    Estimated Creatinine Clearance: 115.4 mL/min (by C-G formula based on SCr of 0.79 mg/dL).    Allergies  Allergen Reactions   Aspirin Anaphylaxis    Antimicrobials this admission: 7/20 Cefepime >>  7/20 Vancomycin >>    Microbiology results: 7/20 BCx: sent 7/20 MRSA PCR: positive  Thank you for allowing pharmacy to be a part of this patient's care.  ARMANDO BUKHARI 01/28/2022 8:46 PM

## 2022-01-28 NOTE — Consult Note (Signed)
PHARMACY -  BRIEF ANTIBIOTIC NOTE   Pharmacy has received consult(s) for sepsis from an ED provider.  The patient's profile has been reviewed for ht/wt/allergies/indication/available labs.    One time order(s) placed for cefepime and vancomycin  Further antibiotics/pharmacy consults should be ordered by admitting physician if indicated.                       Thank you, Ronnald Ramp 01/28/2022  5:38 PM

## 2022-01-28 NOTE — ED Triage Notes (Addendum)
Pt comes into the ED via EMS from [address], with c/o abscess to the left HA with c/o chest pain intermittently, pt is an IV drug user  141/76 HR81 94%RA

## 2022-01-28 NOTE — ED Notes (Signed)
Critical Result: Lactic Acid 2.7  Sidney Ace, MD aware

## 2022-01-28 NOTE — Assessment & Plan Note (Addendum)
Blood pressure (!) 125/92, pulse 80, temperature 98.8 F (37.1 C), resp. rate 15, height 5\' 9"  (1.753 m), weight 79.4 kg, SpO2 97 %.     Latest Ref Rng & Units 01/28/2022    4:10 PM 01/15/2020    8:04 AM 01/14/2020    5:18 PM  CBC  WBC 4.0 - 10.5 K/uL 11.7  9.6  16.9   Hemoglobin 13.0 - 17.0 g/dL 03/16/2020  22.9  79.8   Hematocrit 39.0 - 52.0 % 38.7  43.0  51.8   Platelets 150 - 400 K/uL 225  224  271    Pt meets sepsis criteria with cellulitis and abscess ,elevated lactic and wbc count.  Orthopedic consult.  MRSA precaution.  Pharmacy consult and vancomycin/ cefepime for BS coverage.  Follow cultures.

## 2022-01-28 NOTE — H&P (Addendum)
History and Physical    David Bean [id number] DOB: [birth year] DOA: 01/28/2022  PCP: Patient, No Pcp Per    Patient coming from:  Home   Chief Complaint:  Left forearm abscess   HPI:  David Bean is a 46 y.o. male seen in ed with complaints of coming to [place] for a large abscess in his left forearm is IV drug user ,nontraumatic, pain, redness, and swelling of left arm over the last 5 days. The pain and swelling and redness started in his arm and is now extended to his left hand.White count was like 11 something in the lactic was a little above 2 but he otherwise had no fevers totally stable vitals  otherwise did not meet sepsis criteria but does appreciate a large abscess to talk to Ortho Dr. Mack Guise to admit, n.p.o. at midnight and I will see him tomorrow I started on Vanco and cefepime, blood cultures  BMP looks fine denied chest pain to medial chest pain in the lobby it.  Negative troponins. Pt is pleasant and cooperative. Want to eat and say one sandwich is not enough for him. Gives limited history.   Pt has past medical history of allergy to aspirin, polysubstance abuse.  ED Course:   Vitals:   01/28/22 1550 01/28/22 1552 01/28/22 1845 01/28/22 2309  BP:  121/85 113/68 (!) 125/92  Pulse:  91 77 80  Resp:  16 18 15   Temp:  99.8 F (37.7 C)  98.8 F (37.1 C)  TempSrc:  Oral    SpO2:  100% 99% 97%  Weight: 79.4 kg     Height: 5\' 9"  (1.753 m)     In the emergency room patient is alert awake oriented has a heart rate of 90s, elevated lactate, WBC of 11.4. CMP shows a minute of 3.2 and calcium of 8.2 otherwise within normal limits. Lactic initially of 2.7 and a repeat of 1.6.   CBC shows white count of 11.7 hemoglobin of 12.7 platelet Counts of 225. UDS ordered. Blood cultures obtained today. CT of the left hand/ left forearm and left humerus: IMPRESSION: 1. Pyomyositis involving the flexor carpi radialis and flexor digitorum superficialis muscles with a 6.7 x  3.8 x 8.5 cm intramuscular abscess. 2. Soft tissue edema in the subcutaneous fat involving the volar aspect of the upper arm, circumferentially involving the forearm and dorsal aspect of the hand most concerning for cellulitis. [place] of LUE neg for DVT.  Review of Systems:  Review of Systems  Constitutional:  Positive for chills and fever.  Musculoskeletal:  Positive for joint pain.  All other systems reviewed and are negative.  Past Medical History:  Diagnosis Date   Acute kidney injury ([place]) 01/14/2020   Facial bones, closed fracture ([place]) 09/20/2019   broken nose   Polysubstance abuse ([place])     History reviewed. No pertinent surgical history.   reports that he has been smoking cigarettes. He has been smoking an average of 2 packs per day. He has never used smokeless tobacco. He reports current alcohol use. He reports that he does not use drugs.  Allergies  Allergen Reactions   Aspirin Anaphylaxis    No family history on file.  Prior to Admission medications   Medication Sig Start Date End Date Taking? Authorizing Provider  hydrocortisone cream (PREPARATION H) 1 % Apply 1 application topically 2 (two) times daily. 12/20/19   Lavonia Drafts, MD    Physical Exam: Vitals:   01/28/22 1550 01/28/22 1552 01/28/22 1845  01/28/22 2309  BP:  121/85 113/68 (!) 125/92  Pulse:  91 77 80  Resp:  16 18 15   Temp:  99.8 F (37.7 C)  98.8 F (37.1 C)  TempSrc:  Oral    SpO2:  100% 99% 97%  Weight: 79.4 kg     Height: 5\' 9"  (1.753 m)      Physical Exam Vitals and nursing note reviewed.  Constitutional:      General: He is not in acute distress.    Appearance: Normal appearance. He is not ill-appearing, toxic-appearing or diaphoretic.  HENT:     Head: Normocephalic and atraumatic.     Right Ear: Hearing and external ear normal.     Left Ear: Hearing and external ear normal.     Nose: Nose normal. No nasal deformity.     Mouth/Throat:     Lips: Pink.     Mouth: Mucous membranes  are moist.     Tongue: No lesions.     Pharynx: Oropharynx is clear.  Eyes:     Extraocular Movements: Extraocular movements intact.     Pupils: Pupils are equal, round, and reactive to light.  Cardiovascular:     Rate and Rhythm: Normal rate and regular rhythm.     Pulses: Normal pulses.     Heart sounds: Normal heart sounds.  Pulmonary:     Effort: Pulmonary effort is normal.     Breath sounds: Normal breath sounds.  Abdominal:     General: Bowel sounds are normal. There is no distension.     Palpations: Abdomen is soft. There is no mass.     Tenderness: There is no abdominal tenderness. There is no guarding.     Hernia: No hernia is present.  Musculoskeletal:        General: Swelling and tenderness present.     Left shoulder: Swelling and tenderness present. Decreased range of motion. Normal pulse.     Left upper arm: Swelling and edema present.     Left elbow: Swelling present. Decreased range of motion. Tenderness present.     Left hand: Swelling and tenderness present. Decreased range of motion. Normal pulse.     Right lower leg: No edema.     Left lower leg: No edema.  Skin:    General: Skin is warm.  Neurological:     General: No focal deficit present.     Mental Status: He is alert and oriented to person, place, and time.     Cranial Nerves: Cranial nerves 2-12 are intact.     Motor: Motor function is intact.  Psychiatric:        Attention and Perception: Attention normal.        Speech: Speech normal.        Behavior: Behavior is cooperative.        Cognition and Memory: Cognition normal.      Labs on Admission: I have personally reviewed following labs and imaging studies BMET Recent Labs  Lab 01/28/22 1610  NA 136  K 3.7  CL 101  CO2 28  BUN 10  CREATININE 0.79  GLUCOSE 91   Electrolytes Recent Labs  Lab 01/28/22 1610  CALCIUM 8.2*   Sepsis Markers Recent Labs  Lab 01/28/22 1621 01/28/22 1752  LATICACIDVEN 2.7* 1.6   ABG No results for  input(s): "PHART", "PCO2ART", "PO2ART" in the last 168 hours. Liver Enzymes Recent Labs  Lab 01/28/22 1610  AST 17  ALT 18  ALKPHOS 87  BILITOT 0.5  ALBUMIN 3.2*   Cardiac Enzymes No results for input(s): "TROPONINI", "PROBNP" in the last 168 hours. No results found for: "DDIMER" Coag's No results for input(s): "APTT", "INR" in the last 168 hours.  Recent Results (from the past 240 hour(s))  SARS Coronavirus 2 by RT PCR (hospital order, performed in [facility] hospital lab) *cepheid single result test* Anterior Nasal Swab     Status: None   Collection Time: 01/28/22  5:49 PM   Specimen: Anterior Nasal Swab  Result Value Ref Range Status   SARS Coronavirus 2 by RT PCR NEGATIVE NEGATIVE Final    Comment: (NOTE) SARS-CoV-2 target nucleic acids are NOT DETECTED.  The SARS-CoV-2 RNA is generally detectable in upper and lower respiratory specimens during the acute phase of infection. The lowest concentration of SARS-CoV-2 viral copies this assay can detect is 250 copies / mL. A negative result does not preclude SARS-CoV-2 infection and should not be used as the sole basis for treatment or other patient management decisions.  A negative result may occur with improper specimen collection / handling, submission of specimen other than nasopharyngeal swab, presence of viral mutation(s) within the areas targeted by this assay, and inadequate number of viral copies (<250 copies / mL). A negative result must be combined with clinical observations, patient history, and epidemiological information.  Fact Sheet for Patients:   https://www.[place].info/  Fact Sheet for Healthcare Providers: https://hall.com/  This test is not yet approved or  cleared by the [place] FDA and has been authorized for detection and/or diagnosis of SARS-CoV-2 by FDA under an Emergency Use Authorization (EUA).  This EUA will remain in effect (meaning this test can  be used) for the duration of the COVID-19 declaration under Section 564(b)(1) of the Act, 21 U.S.C. section 360bbb-3(b)(1), unless the authorization is terminated or revoked sooner.  Performed at [facility], [address] [place], [place] [address]   MRSA Next Gen by PCR, Nasal     Status: Abnormal   Collection Time: 01/28/22  5:49 PM   Specimen: Nasal Mucosa; Nasal Swab  Result Value Ref Range Status   MRSA by PCR Next Gen DETECTED (A) NOT DETECTED Final    Comment: CRITICAL RESULT CALLED TO, READ BACK BY AND VERIFIED WITH: RACHAEL MERKLE @ 2008 ON 01/28/2022.Marland KitchenMarland KitchenTKR (NOTE) The GeneXpert MRSA Assay (FDA approved for NASAL specimens only), is one component of a comprehensive MRSA colonization surveillance program. It is not intended to diagnose MRSA infection nor to guide or monitor treatment for MRSA infections. Test performance is not FDA approved in patients less than 64 years old. Performed at [facility], [address] [place], [place] [address]      Current Facility-Administered Medications:    0.9 %  sodium chloride infusion, , Intravenous, Continuous, Posey Pronto, Gretta Cool, MD   acetaminophen (TYLENOL) tablet 650 mg, 650 mg, Oral, Q6H PRN **OR** acetaminophen (TYLENOL) suppository 650 mg, 650 mg, Rectal, Q6H PRN, Posey Pronto, Gretta Cool, MD   ceFEPIme (MAXIPIME) 2 g in sodium chloride 0.9 % 100 mL IVPB, 2 g, Intravenous, Q8H, Aviraj, Ells, RPH   HYDROcodone-acetaminophen (NORCO/VICODIN) 5-325 MG per tablet 1 tablet, 1 tablet, Oral, Q4H PRN, Florina Ou V, MD   morphine (PF) 2 MG/ML injection 2 mg, 2 mg, Intravenous, Q4H PRN, Para Skeans, MD   nicotine (NICODERM CQ - dosed in mg/24 hours) patch 21 mg, 21 mg, Transdermal, Daily, Elizabethanne Lusher V, MD   ondansetron (ZOFRAN) tablet 4 mg, 4 mg, Oral, Q6H PRN **OR**  ondansetron (ZOFRAN) injection 4 mg, 4 mg, Intravenous, Q6H PRN, Posey Pronto, Sinthia Karabin V, MD   sodium chloride flush (NS) 0.9 % injection 3 mL, 3 mL, Intravenous,  Q12H, Florina Ou V, MD, 3 mL at 01/28/22 2320   thiamine tablet 100 mg, 100 mg, Oral, Daily, Para Skeans, MD  COVID-19 Labs No results for input(s): "DDIMER", "FERRITIN", "LDH", "CRP" in the last 72 hours. Lab Results  Component Value Date   SARSCOV2NAA NEGATIVE 01/28/2022   [place] NEGATIVE 01/14/2020    Radiological Exams on Admission: CT HUMERUS LEFT W CONTRAST  Result Date: 01/28/2022 CLINICAL DATA:  Left lower extremity pain and swelling. EXAM: CT OF THE UPPER LEFT HUMERUS WITH CONTRAST CT OF THE UPPER LEFT FOREARM WITH CONTRAST CT OF THE UPPER LEFT HAND WITH CONTRAST TECHNIQUE: Multidetector CT imaging of the left humerus was performed according to the standard protocol following intravenous contrast administration. Multidetector CT imaging of the left forearm was performed according to the standard protocol following intravenous contrast administration. Multidetector CT imaging of the left hand was performed according to the standard protocol following intravenous contrast administration. RADIATION DOSE REDUCTION: This exam was performed according to the departmental dose-optimization program which includes automated exposure control, adjustment of the mA and/or kV according to patient size and/or use of iterative reconstruction technique. CONTRAST:  58mL OMNIPAQUE IOHEXOL 300 MG/ML  SOLN COMPARISON:  None Available. FINDINGS: Bones/Joint/Cartilage No fracture or dislocation. Normal alignment. No joint effusion. Ligaments Ligaments are suboptimally evaluated by CT. Muscles and Tendons 6.7 x 3.8 x 8.5 cm peripherally enhancing complex fluid collection in the flexor carpi radialis and flexor digitorum superficialis muscles most consistent with an intramuscular abscess. Remainder the muscles demonstrate no focal abnormality. Soft tissue No fluid collection or hematoma. No soft tissue mass. Soft tissue edema in the subcutaneous fat involving the volar aspect of the upper arm,  circumferentially involving the forearm and dorsal aspect of the hand most concerning for cellulitis. IMPRESSION: 1. Pyomyositis involving the flexor carpi radialis and flexor digitorum superficialis muscles with a 6.7 x 3.8 x 8.5 cm intramuscular abscess. 2. Soft tissue edema in the subcutaneous fat involving the volar aspect of the upper arm, circumferentially involving the forearm and dorsal aspect of the hand most concerning for cellulitis. Electronically Signed   By: Kathreen Devoid M.D.   On: 01/28/2022 18:36   CT FOREARM LEFT W CONTRAST  Result Date: 01/28/2022 CLINICAL DATA:  Left lower extremity pain and swelling. EXAM: CT OF THE UPPER LEFT HUMERUS WITH CONTRAST CT OF THE UPPER LEFT FOREARM WITH CONTRAST CT OF THE UPPER LEFT HAND WITH CONTRAST TECHNIQUE: Multidetector CT imaging of the left humerus was performed according to the standard protocol following intravenous contrast administration. Multidetector CT imaging of the left forearm was performed according to the standard protocol following intravenous contrast administration. Multidetector CT imaging of the left hand was performed according to the standard protocol following intravenous contrast administration. RADIATION DOSE REDUCTION: This exam was performed according to the departmental dose-optimization program which includes automated exposure control, adjustment of the mA and/or kV according to patient size and/or use of iterative reconstruction technique. CONTRAST:  42mL OMNIPAQUE IOHEXOL 300 MG/ML  SOLN COMPARISON:  None Available. FINDINGS: Bones/Joint/Cartilage No fracture or dislocation. Normal alignment. No joint effusion. Ligaments Ligaments are suboptimally evaluated by CT. Muscles and Tendons 6.7 x 3.8 x 8.5 cm peripherally enhancing complex fluid collection in the flexor carpi radialis and flexor digitorum superficialis muscles most consistent with an intramuscular abscess. Remainder the muscles demonstrate no  focal abnormality. Soft  tissue No fluid collection or hematoma. No soft tissue mass. Soft tissue edema in the subcutaneous fat involving the volar aspect of the upper arm, circumferentially involving the forearm and dorsal aspect of the hand most concerning for cellulitis. IMPRESSION: 1. Pyomyositis involving the flexor carpi radialis and flexor digitorum superficialis muscles with a 6.7 x 3.8 x 8.5 cm intramuscular abscess. 2. Soft tissue edema in the subcutaneous fat involving the volar aspect of the upper arm, circumferentially involving the forearm and dorsal aspect of the hand most concerning for cellulitis. Electronically Signed   By: Elige Ko M.D.   On: 01/28/2022 18:36   CT HAND LEFT W CONTRAST  Result Date: 01/28/2022 CLINICAL DATA:  Left lower extremity pain and swelling. EXAM: CT OF THE UPPER LEFT HUMERUS WITH CONTRAST CT OF THE UPPER LEFT FOREARM WITH CONTRAST CT OF THE UPPER LEFT HAND WITH CONTRAST TECHNIQUE: Multidetector CT imaging of the left humerus was performed according to the standard protocol following intravenous contrast administration. Multidetector CT imaging of the left forearm was performed according to the standard protocol following intravenous contrast administration. Multidetector CT imaging of the left hand was performed according to the standard protocol following intravenous contrast administration. RADIATION DOSE REDUCTION: This exam was performed according to the departmental dose-optimization program which includes automated exposure control, adjustment of the mA and/or kV according to patient size and/or use of iterative reconstruction technique. CONTRAST:  43mL OMNIPAQUE IOHEXOL 300 MG/ML  SOLN COMPARISON:  None Available. FINDINGS: Bones/Joint/Cartilage No fracture or dislocation. Normal alignment. No joint effusion. Ligaments Ligaments are suboptimally evaluated by CT. Muscles and Tendons 6.7 x 3.8 x 8.5 cm peripherally enhancing complex fluid collection in the flexor carpi radialis and  flexor digitorum superficialis muscles most consistent with an intramuscular abscess. Remainder the muscles demonstrate no focal abnormality. Soft tissue No fluid collection or hematoma. No soft tissue mass. Soft tissue edema in the subcutaneous fat involving the volar aspect of the upper arm, circumferentially involving the forearm and dorsal aspect of the hand most concerning for cellulitis. IMPRESSION: 1. Pyomyositis involving the flexor carpi radialis and flexor digitorum superficialis muscles with a 6.7 x 3.8 x 8.5 cm intramuscular abscess. 2. Soft tissue edema in the subcutaneous fat involving the volar aspect of the upper arm, circumferentially involving the forearm and dorsal aspect of the hand most concerning for cellulitis. Electronically Signed   By: Elige Ko M.D.   On: 01/28/2022 18:36   US Venous Img Upper Uni Left  Result Date: 01/28/2022 CLINICAL DATA:  Intermittent chest pain, left arm edema EXAM: LEFT UPPER EXTREMITY VENOUS DOPPLER ULTRASOUND TECHNIQUE: Gray-scale sonography with graded compression, as well as color Doppler and duplex ultrasound were performed to evaluate the upper extremity deep venous system from the level of the subclavian vein and including the jugular, axillary, basilic, radial, ulnar and upper cephalic vein. Spectral Doppler was utilized to evaluate flow at rest and with distal augmentation maneuvers. COMPARISON:  None Available. FINDINGS: Contralateral Subclavian Vein: Respiratory phasicity is normal and symmetric with the symptomatic side. No evidence of thrombus. Normal compressibility. Internal Jugular Vein: No evidence of thrombus. Normal compressibility, respiratory phasicity and response to augmentation. Subclavian Vein: No evidence of thrombus. Normal compressibility, respiratory phasicity and response to augmentation. Axillary Vein: No evidence of thrombus. Normal compressibility, respiratory phasicity and response to augmentation. Cephalic Vein: Not  visualized. Basilic Vein: No evidence of thrombus. Normal compressibility, respiratory phasicity and response to augmentation. Brachial Veins: No evidence of thrombus. Normal compressibility, respiratory  phasicity and response to augmentation. Radial Veins: No evidence of thrombus. Normal compressibility, respiratory phasicity and response to augmentation. Ulnar Veins: No evidence of thrombus. Normal compressibility, respiratory phasicity and response to augmentation. Venous Reflux:  None visualized. Other Findings:  None visualized. IMPRESSION: The left cephalic vein could not be visualized. No evidence of DVT within the remainder of the left upper extremity. Electronically Signed   By: Merilyn Baba M.D.   On: 01/28/2022 17:47   DG Chest 2 View  Result Date: 01/28/2022 CLINICAL DATA:  Left arm edema, intermittent chest pain, fever and chills EXAM: CHEST - 2 VIEW COMPARISON:  07/16/2016 FINDINGS: Frontal and lateral views of the chest demonstrate an unremarkable cardiac silhouette. No acute airspace disease, effusion, or pneumothorax. There are no acute bony abnormalities. IMPRESSION: 1. No acute intrathoracic process. Electronically Signed   By: Randa Ngo M.D.   On: 01/28/2022 16:58    EKG: Independently reviewed.  SR and pvc.     Assessment and Plan: * Abscess of upper arm and forearm, left      CT of the left hand/ left forearm and left humerus: IMPRESSION: 1. Pyomyositis involving the flexor carpi radialis and flexor digitorum superficialis muscles with a 6.7 x 3.8 x 8.5 cm intramuscular abscess. 2. Soft tissue edema in the subcutaneous fat involving the volar aspect of the upper arm, circumferentially involving the forearm and dorsal aspect of the hand most concerning for cellulitis. [place] of LUE neg for DVT. We will continue patient on vancomycin and cefepime along with pharmacy consult. Orthopedics consulted n.p.o. after midnight for anticipated I&D. MIVF. Pain control.   Neuro vascular checks for next 24 hours.   Sepsis ([place]) Blood pressure (!) 125/92, pulse 80, temperature 98.8 F (37.1 C), resp. rate 15, height 5\' 9"  (1.753 m), weight 79.4 kg, SpO2 97 %.     Latest Ref Rng & Units 01/28/2022    4:10 PM 01/15/2020    8:04 AM 01/14/2020    5:18 PM  CBC  WBC 4.0 - 10.5 K/uL 11.7  9.6  16.9   Hemoglobin 13.0 - 17.0 g/dL 12.7  14.2  17.6   Hematocrit 39.0 - 52.0 % 38.7  43.0  51.8   Platelets 150 - 400 K/uL 225  224  271    Pt meets sepsis criteria with cellulitis and abscess ,elevated lactic and wbc count.  Orthopedic consult.  MRSA precaution.  Pharmacy consult and vancomycin/ cefepime for BS coverage.  Follow cultures.    Polysubstance abuse [facility]) Psych consult  per am team.  counseling once pt is settles and out of pain.  EKG/ UDS.    DVT prophylaxis:  SCDs  Code Status:  Full code  Family Communication:  Quentrell [id number]  Disposition Plan:  Home  Consults called:  Orthopedic: Dr. Mack Guise  Admission status: Inpatient  Para Skeans MD [facility]  6 PM- 2 AM. Please contact me via secure Chat 6 PM-2 AM. [phone] ( Pager ) To contact the [facility] Attending or Consulting provider [place] or covering provider during after hours [place], for this patient.   Check the care team in [facility] and look for a) attending/consulting [facility] provider listed and b) the [facility] team listed Log into [email or web address] and use [place]'s universal password to access. If you do not have the password, please contact the hospital operator. Locate the [facility] provider you are looking for under [facility] and page to a number that you can be directly reached. If  you still have difficulty reaching the provider, please page the [facility] (Director on Call) for the Hospitalists listed on amion for assistance. [email or web address] 01/29/2022, 2:22 AM

## 2022-01-28 NOTE — ED Provider Triage Note (Signed)
Emergency Medicine Provider Triage Evaluation Note  David Bean , a 46 y.o. male  was evaluated in triage.  Pt complains of left arm edema, possible infection.  Patient states that he has had symptoms to the left arm for the last 3 to 4 days.  Started in the arm and tracked down to his hand.  Positive for fevers and chills.  Patient complaining of some intermittent chest pain as well.  No shortness of breath..  Review of Systems  Positive: Left upper extremity pain, edema.  Patient has had fevers, chills, intermittent chest pain Negative: Headache, neck pain, shortness of breath, GI complaints.  Congestion, cough, sore throat.  Physical Exam  BP 121/85 (BP Location: Right Arm)   Pulse 91   Temp 99.8 F (37.7 C) (Oral)   Resp 16   Ht 5\' 9"  (1.753 m)   Wt 79.4 kg   SpO2 100%   BMI 25.84 kg/m  Gen:   Awake, no distress   Resp:  Normal effort  MSK:   Moves extremities without difficulty.  Patient with gross edema, erythema and tenderness from the distal humerus through the hand.  Compartments are firm Other:    Medical Decision Making  Medically screening exam initiated at 3:55 PM.  Appropriate orders placed.  was informed that the remainder of the evaluation will be completed by another provider, this initial triage assessment does not replace that evaluation, and the importance of remaining in the ED until their evaluation is complete.  Patient with left arm edema, erythema.  History of IV drug use.  Suspect infection at this time.  We will evaluate the patient with labs, EKG, ultrasound of the upper extremity as well as CT scan.   Ronni Rumble, PA-C 01/28/22 1555

## 2022-01-29 ENCOUNTER — Other Ambulatory Visit: Payer: Self-pay

## 2022-01-29 ENCOUNTER — Encounter
Admission: EM | Discharge status: Against Medical Advice | Payer: Self-pay | Source: Home / Self Care | Attending: Obstetrics and Gynecology

## 2022-01-29 ENCOUNTER — Inpatient Hospital Stay: Payer: Self-pay | Admitting: Anesthesiology

## 2022-01-29 ENCOUNTER — Encounter: Payer: Self-pay | Admitting: Internal Medicine

## 2022-01-29 HISTORY — PX: INCISION AND DRAINAGE ABSCESS: SHX5864

## 2022-01-29 LAB — CBC
HCT: 33.2 % — ABNORMAL LOW (ref 39.0–52.0)
Hemoglobin: 11.2 g/dL — ABNORMAL LOW (ref 13.0–17.0)
MCH: 31.2 pg (ref 26.0–34.0)
MCHC: 33.7 g/dL (ref 30.0–36.0)
MCV: 92.5 fL (ref 80.0–100.0)
Platelets: 206 10*3/uL (ref 150–400)
RBC: 3.59 MIL/uL — ABNORMAL LOW (ref 4.22–5.81)
RDW: 12.6 % (ref 11.5–15.5)
WBC: 11 10*3/uL — ABNORMAL HIGH (ref 4.0–10.5)
nRBC: 0 % (ref 0.0–0.2)

## 2022-01-29 LAB — COMPREHENSIVE METABOLIC PANEL
ALT: 15 U/L (ref 0–44)
AST: 15 U/L (ref 15–41)
Albumin: 2.6 g/dL — ABNORMAL LOW (ref 3.5–5.0)
Alkaline Phosphatase: 78 U/L (ref 38–126)
Anion gap: 8 (ref 5–15)
BUN: 11 mg/dL (ref 6–20)
CO2: 21 mmol/L — ABNORMAL LOW (ref 22–32)
Calcium: 7.7 mg/dL — ABNORMAL LOW (ref 8.9–10.3)
Chloride: 107 mmol/L (ref 98–111)
Creatinine, Ser: 0.66 mg/dL (ref 0.61–1.24)
GFR, Estimated: >60 mL/min (ref >60)
Glucose, Bld: 101 mg/dL — ABNORMAL HIGH (ref 70–99)
Potassium: 3.8 mmol/L (ref 3.5–5.1)
Sodium: 136 mmol/L (ref 135–145)
Total Bilirubin: 0.8 mg/dL (ref 0.3–1.2)
Total Protein: 5.9 g/dL — ABNORMAL LOW (ref 6.5–8.1)

## 2022-01-29 LAB — URINE DRUG SCREEN, QUALITATIVE (ARMC ONLY)
Amphetamines, Ur Screen: POSITIVE — AB
Barbiturates, Ur Screen: NOT DETECTED
Benzodiazepine, Ur Scrn: NOT DETECTED
Cannabinoid 50 Ng, Ur ~~LOC~~: NOT DETECTED
Cocaine Metabolite,Ur ~~LOC~~: NOT DETECTED
MDMA (Ecstasy)Ur Screen: NOT DETECTED
Methadone Scn, Ur: NOT DETECTED
Opiate, Ur Screen: POSITIVE — AB
Phencyclidine (PCP) Ur S: NOT DETECTED
Tricyclic, Ur Screen: NOT DETECTED

## 2022-01-29 LAB — HEPATITIS PANEL, ACUTE
HCV Ab: NONREACTIVE
Hep A IgM: NONREACTIVE
Hep B C IgM: REACTIVE — AB
Hepatitis B Surface Ag: NONREACTIVE

## 2022-01-29 LAB — HIV ANTIBODY (ROUTINE TESTING W REFLEX): HIV Screen 4th Generation wRfx: NONREACTIVE

## 2022-01-29 SURGERY — INCISION AND DRAINAGE, ABSCESS
Anesthesia: General | Site: Arm Upper | Laterality: Left

## 2022-01-29 MED ORDER — LIDOCAINE HCL (CARDIAC) PF 100 MG/5ML IV SOSY
PREFILLED_SYRINGE | INTRAVENOUS | Status: DC | PRN
  Administered 2022-01-29: 80 mg via INTRAVENOUS

## 2022-01-29 MED ORDER — ACETAMINOPHEN 10 MG/ML IV SOLN: 1000.0000 mg | Freq: Once | INTRAVENOUS | Status: DC | PRN

## 2022-01-29 MED ORDER — OXYCODONE HCL 5 MG PO TABS: 5.0000 mg | ORAL_TABLET | Freq: Once | ORAL | Status: DC | PRN

## 2022-01-29 MED ORDER — SUCCINYLCHOLINE CHLORIDE 200 MG/10ML IV SOSY
PREFILLED_SYRINGE | INTRAVENOUS | Status: DC | PRN
  Administered 2022-01-29: 140 mg via INTRAVENOUS

## 2022-01-29 MED ORDER — DEXMEDETOMIDINE (PRECEDEX) IN NS 20 MCG/5ML (4 MCG/ML) IV SYRINGE
PREFILLED_SYRINGE | INTRAVENOUS | Status: DC | PRN
  Administered 2022-01-29: 8 ug via INTRAVENOUS

## 2022-01-29 MED ORDER — FENTANYL CITRATE (PF) 100 MCG/2ML IJ SOLN
INTRAMUSCULAR | Status: AC
  Filled 2022-01-29: qty 2

## 2022-01-29 MED ORDER — FENTANYL CITRATE (PF) 100 MCG/2ML IJ SOLN
25.0000 ug | INTRAMUSCULAR | Status: DC | PRN
  Administered 2022-01-29: 50 ug via INTRAVENOUS

## 2022-01-29 MED ORDER — TETANUS-DIPHTH-ACELL PERTUSSIS 5-2.5-18.5 LF-MCG/0.5 IM SUSY
0.5000 mL | PREFILLED_SYRINGE | Freq: Once | INTRAMUSCULAR | Status: AC
Start: 2022-01-29 — End: 2022-01-29
  Administered 2022-01-29: 0.5 mL via INTRAMUSCULAR
  Filled 2022-01-29: qty 0.5

## 2022-01-29 MED ORDER — PHENYLEPHRINE HCL-NACL 20-0.9 MG/250ML-% IV SOLN
INTRAVENOUS | Status: AC
  Filled 2022-01-29: qty 250

## 2022-01-29 MED ORDER — PHENYLEPHRINE HCL (PRESSORS) 10 MG/ML IV SOLN
INTRAVENOUS | Status: DC | PRN
  Administered 2022-01-29 (×4): 160 ug via INTRAVENOUS
  Administered 2022-01-29: 80 ug via INTRAVENOUS

## 2022-01-29 MED ORDER — HYDROMORPHONE HCL 1 MG/ML IJ SOLN
INTRAMUSCULAR | Status: DC | PRN
  Administered 2022-01-29: .5 mg via INTRAVENOUS

## 2022-01-29 MED ORDER — PROPOFOL 10 MG/ML IV BOLUS
INTRAVENOUS | Status: AC
  Filled 2022-01-29: qty 20

## 2022-01-29 MED ORDER — ROCURONIUM BROMIDE 10 MG/ML (PF) SYRINGE
PREFILLED_SYRINGE | INTRAVENOUS | Status: AC
  Filled 2022-01-29: qty 10

## 2022-01-29 MED ORDER — ONDANSETRON HCL 4 MG/2ML IJ SOLN
INTRAMUSCULAR | Status: AC
  Filled 2022-01-29: qty 2

## 2022-01-29 MED ORDER — LACTATED RINGERS IV SOLN: INTRAVENOUS | Status: DC

## 2022-01-29 MED ORDER — DEXAMETHASONE SODIUM PHOSPHATE 10 MG/ML IJ SOLN
INTRAMUSCULAR | Status: DC | PRN
  Administered 2022-01-29: 5 mg via INTRAVENOUS

## 2022-01-29 MED ORDER — DEXAMETHASONE SODIUM PHOSPHATE 10 MG/ML IJ SOLN
INTRAMUSCULAR | Status: AC
  Filled 2022-01-29: qty 1

## 2022-01-29 MED ORDER — VANCOMYCIN HCL 1500 MG/300ML IV SOLN
1500.0000 mg | Freq: Two times a day (BID) | INTRAVENOUS | Status: DC
  Administered 2022-01-29: 1500 mg via INTRAVENOUS
  Filled 2022-01-29 (×2): qty 300

## 2022-01-29 MED ORDER — NEOMYCIN-POLYMYXIN B GU 40-200000 IR SOLN
Status: AC
Start: 2022-01-29 — End: ?
  Filled 2022-01-29: qty 20

## 2022-01-29 MED ORDER — ONDANSETRON HCL 4 MG/2ML IJ SOLN
INTRAMUSCULAR | Status: DC | PRN
  Administered 2022-01-29: 4 mg via INTRAVENOUS

## 2022-01-29 MED ORDER — MIDAZOLAM HCL 2 MG/2ML IJ SOLN
INTRAMUSCULAR | Status: AC
  Filled 2022-01-29: qty 2

## 2022-01-29 MED ORDER — 0.9 % SODIUM CHLORIDE (POUR BTL) OPTIME
TOPICAL | Status: DC | PRN
  Administered 2022-01-29: 8000 mL

## 2022-01-29 MED ORDER — FENTANYL CITRATE (PF) 100 MCG/2ML IJ SOLN
INTRAMUSCULAR | Status: AC
  Administered 2022-01-29: 50 ug via INTRAVENOUS
  Filled 2022-01-29: qty 2

## 2022-01-29 MED ORDER — HYDROMORPHONE HCL 1 MG/ML IJ SOLN
INTRAMUSCULAR | Status: AC
  Filled 2022-01-29: qty 1

## 2022-01-29 MED ORDER — METRONIDAZOLE 500 MG/100ML IV SOLN
500.0000 mg | Freq: Two times a day (BID) | INTRAVENOUS | Status: DC
  Administered 2022-01-29: 500 mg via INTRAVENOUS
  Filled 2022-01-29: qty 100

## 2022-01-29 MED ORDER — NEOMYCIN-POLYMYXIN B GU 40-200000 IR SOLN
Status: DC | PRN
  Administered 2022-01-29: 24 mL

## 2022-01-29 MED ORDER — ONDANSETRON HCL 4 MG/2ML IJ SOLN: 4.0000 mg | Freq: Once | INTRAMUSCULAR | Status: DC | PRN

## 2022-01-29 MED ORDER — PHENYLEPHRINE HCL-NACL 20-0.9 MG/250ML-% IV SOLN
INTRAVENOUS | Status: DC | PRN
  Administered 2022-01-29: 40 ug/min via INTRAVENOUS

## 2022-01-29 MED ORDER — FENTANYL CITRATE (PF) 100 MCG/2ML IJ SOLN
INTRAMUSCULAR | Status: DC | PRN
  Administered 2022-01-29 (×2): 50 ug via INTRAVENOUS

## 2022-01-29 MED ORDER — LIDOCAINE HCL (PF) 2 % IJ SOLN
INTRAMUSCULAR | Status: AC
  Filled 2022-01-29: qty 5

## 2022-01-29 MED ORDER — MIDAZOLAM HCL 2 MG/2ML IJ SOLN
INTRAMUSCULAR | Status: DC | PRN
  Administered 2022-01-29: 2 mg via INTRAVENOUS

## 2022-01-29 MED ORDER — SODIUM CHLORIDE 0.9 % IV SOLN
2.0000 g | Freq: Three times a day (TID) | INTRAVENOUS | Status: DC
  Filled 2022-01-29 (×2): qty 12.5

## 2022-01-29 MED ORDER — PROPOFOL 10 MG/ML IV BOLUS
INTRAVENOUS | Status: DC | PRN
  Administered 2022-01-29: 170 mg via INTRAVENOUS

## 2022-01-29 MED ORDER — OXYCODONE HCL 5 MG/5ML PO SOLN: 5.0000 mg | Freq: Once | ORAL | Status: DC | PRN

## 2022-01-29 SURGICAL SUPPLY — 56 items
BNDG COHESIVE 6X5 TAN ST LF (GAUZE/BANDAGES/DRESSINGS) ×3 IMPLANT
BNDG ELASTIC 4X5.8 VLCR STR LF (GAUZE/BANDAGES/DRESSINGS) ×1 IMPLANT
CUFF TOURN SGL QUICK 18X4 (TOURNIQUET CUFF) ×1 IMPLANT
CUFF TOURN SGL QUICK 24 (TOURNIQUET CUFF)
CUFF TRNQT CYL 24X4X16.5-23 (TOURNIQUET CUFF) IMPLANT
DRAPE 3/4 80X56 (DRAPES) ×2 IMPLANT
DRAPE INCISE IOBAN 66X60 STRL (DRAPES) ×2 IMPLANT
DRAPE SURG 17X11 SM STRL (DRAPES) ×4 IMPLANT
DRAPE U-SHAPE 47X51 STRL (DRAPES) ×2 IMPLANT
DURAPREP 26ML APPLICATOR (WOUND CARE) ×6 IMPLANT
ELECT CAUTERY BLADE 6.4 (BLADE) ×2 IMPLANT
ELECT REM PT RETURN 9FT ADLT (ELECTROSURGICAL) ×2
ELECTRODE REM PT RTRN 9FT ADLT (ELECTROSURGICAL) ×1 IMPLANT
GAUZE SPONGE 4X4 12PLY STRL (GAUZE/BANDAGES/DRESSINGS) ×3 IMPLANT
GAUZE XEROFORM 1X8 LF (GAUZE/BANDAGES/DRESSINGS) ×2 IMPLANT
GLOVE BIOGEL PI IND STRL 9 (GLOVE) ×1 IMPLANT
GLOVE BIOGEL PI INDICATOR 9 (GLOVE) ×1
GLOVE SURG ORTHO 9.0 STRL STRW (GLOVE) ×6 IMPLANT
GOWN STRL REUS TWL 2XL XL LVL4 (GOWN DISPOSABLE) ×2 IMPLANT
GOWN STRL REUS W/ TWL LRG LVL3 (GOWN DISPOSABLE) ×1 IMPLANT
GOWN STRL REUS W/TWL LRG LVL3 (GOWN DISPOSABLE) ×1
HANDLE YANKAUER SUCT BULB TIP (MISCELLANEOUS) ×1 IMPLANT
HEMOVAC 400ML (MISCELLANEOUS) ×2
IV NS IRRIG 3000ML ARTHROMATIC (IV SOLUTION) ×2 IMPLANT
KIT DRAIN HEMOVAC JP 7FR 400ML (MISCELLANEOUS) ×1 IMPLANT
KIT TURNOVER KIT A (KITS) ×2 IMPLANT
MANIFOLD NEPTUNE II (INSTRUMENTS) ×2 IMPLANT
NDL FILTER BLUNT 18X1 1/2 (NEEDLE) ×1 IMPLANT
NDL SAFETY ECLIPSE 18X1.5 (NEEDLE) ×1 IMPLANT
NEEDLE FILTER BLUNT 18X 1/2SAF (NEEDLE) ×1
NEEDLE FILTER BLUNT 18X1 1/2 (NEEDLE) ×1 IMPLANT
NEEDLE HYPO 18GX1.5 SHARP (NEEDLE) ×1
NS IRRIG 500ML POUR BTL (IV SOLUTION) ×2 IMPLANT
PACK EXTREMITY ARMC (MISCELLANEOUS) ×2 IMPLANT
PAD ABD DERMACEA PRESS 5X9 (GAUZE/BANDAGES/DRESSINGS) ×1 IMPLANT
PAD CAST 4YDX4 CTTN HI CHSV (CAST SUPPLIES) IMPLANT
PADDING CAST COTTON 4X4 STRL (CAST SUPPLIES) ×2
PULSAVAC PLUS IRRIG FAN TIP (DISPOSABLE) ×2
SPONGE T-LAP 18X18 ~~LOC~~+RFID (SPONGE) ×1 IMPLANT
STAPLER SKIN PROX 35W (STAPLE) ×2 IMPLANT
STOCKINETTE M/LG 89821 (MISCELLANEOUS) ×1 IMPLANT
STRIP CLOSURE SKIN 1/2X4 (GAUZE/BANDAGES/DRESSINGS) ×1 IMPLANT
SUT ETHIBOND #5 BRAIDED 30INL (SUTURE) ×1 IMPLANT
SUT ETHILON 4-0 (SUTURE) ×1
SUT ETHILON 4-0 FS2 18XMFL BLK (SUTURE) ×1
SUT PDS PLUS 2 (SUTURE) ×1
SUT PDS PLUS AB 2-0 CT-1 (SUTURE) IMPLANT
SUT TICRON 2-0 30IN 311381 (SUTURE) ×1 IMPLANT
SUT VIC AB 0 CT1 27 (SUTURE)
SUT VIC AB 0 CT1 27XCR 8 STRN (SUTURE) ×1 IMPLANT
SUT VIC AB 1 CTX 27 (SUTURE) ×1 IMPLANT
SUTURE ETHLN 4-0 FS2 18XMF BLK (SUTURE) IMPLANT
SYR 10ML LL (SYRINGE) ×2 IMPLANT
TAPE MICROFOAM 4IN (TAPE) ×2 IMPLANT
TIP FAN IRRIG PULSAVAC PLUS (DISPOSABLE) IMPLANT
WATER STERILE IRR 500ML POUR (IV SOLUTION) ×2 IMPLANT

## 2022-01-29 NOTE — Progress Notes (Signed)
Subjective:  POST OP CHECK s/p incision and drainage of left forearm abscess.   Patient seen with his RN at the bedside.  Patient will not open his eyes and does not respond to questions.  He does however complain when his left upper extremity was manipulated to check his dressings.  Objective:   VITALS:   Vitals:   01/29/22 1445 01/29/22 1450 01/29/22 1500 01/29/22 1515  BP: 105/80  104/75 103/73  Pulse: 77 74 73 73  Resp: 18 16 13 14   Temp:    97.6 F (36.4 C)  TempSrc:      SpO2: 96% 97% 95% 96%  Weight:      Height:        PHYSICAL EXAM: Left upper extremity: Patient will not participate with exam. Patient's left forearm and elbow dressing is clean dry and intact.  Patient's hand is well-perfused.  Compartments are soft and compressible.  LABS  Results for orders placed or performed during the hospital encounter of 01/28/22 (from the past 24 hour(s))  Culture, blood (routine x 2)     Status: None (Preliminary result)   Collection Time: 01/28/22  4:09 PM   Specimen: BLOOD  Result Value Ref Range   Specimen Description BLOOD RIGHT ANTECUBITAL    Special Requests      BOTTLES DRAWN AEROBIC AND ANAEROBIC Blood Culture adequate volume   Culture      NO GROWTH < 12 HOURS Performed at [facility], [address] [place], [place] [address]    Report Status PENDING   Comprehensive metabolic panel     Status: Abnormal   Collection Time: 01/28/22  4:10 PM  Result Value Ref Range   Sodium 136 135 - 145 mmol/L   Potassium 3.7 3.5 - 5.1 mmol/L   Chloride 101 98 - 111 mmol/L   CO2 28 22 - 32 mmol/L   Glucose, Bld 91 70 - 99 mg/dL   BUN 10 6 - 20 mg/dL   Creatinine, Ser 0.79 0.61 - 1.24 mg/dL   Calcium 8.2 (L) 8.9 - 10.3 mg/dL   Total Protein 7.0 6.5 - 8.1 g/dL   Albumin 3.2 (L) 3.5 - 5.0 g/dL   AST 17 15 - 41 U/L   ALT 18 0 - 44 U/L   Alkaline Phosphatase 87 38 - 126 U/L   Total Bilirubin 0.5 0.3 - 1.2 mg/dL   GFR, Estimated >60 >60 mL/min   Anion gap 7 5  - 15  Troponin I (High Sensitivity)     Status: None   Collection Time: 01/28/22  4:10 PM  Result Value Ref Range   Troponin I (High Sensitivity) 5 <18 ng/L  CBC with Differential     Status: Abnormal   Collection Time: 01/28/22  4:10 PM  Result Value Ref Range   WBC 11.7 (H) 4.0 - 10.5 K/uL   RBC 4.17 (L) 4.22 - 5.81 MIL/uL   Hemoglobin 12.7 (L) 13.0 - 17.0 g/dL   HCT 38.7 (L) 39.0 - 52.0 %   MCV 92.8 80.0 - 100.0 fL   MCH 30.5 26.0 - 34.0 pg   MCHC 32.8 30.0 - 36.0 g/dL   RDW 12.7 11.5 - 15.5 %   Platelets 225 150 - 400 K/uL   nRBC 0.0 0.0 - 0.2 %   Neutrophils Relative % 78 %   Neutro Abs 9.1 (H) 1.7 - 7.7 K/uL   Lymphocytes Relative 10 %   Lymphs Abs 1.2 0.7 - 4.0 K/uL   Monocytes Relative  11 %   Monocytes Absolute 1.2 (H) 0.1 - 1.0 K/uL   Eosinophils Relative 1 %   Eosinophils Absolute 0.1 0.0 - 0.5 K/uL   Basophils Relative 0 %   Basophils Absolute 0.0 0.0 - 0.1 K/uL   Immature Granulocytes 0 %   Abs Immature Granulocytes 0.04 0.00 - 0.07 K/uL  Culture, blood (routine x 2)     Status: None (Preliminary result)   Collection Time: 01/28/22  4:16 PM   Specimen: BLOOD  Result Value Ref Range   Specimen Description BLOOD BLOOD RIGHT HAND    Special Requests      BOTTLES DRAWN AEROBIC ONLY Blood Culture results may not be optimal due to an inadequate volume of blood received in culture bottles   Culture      NO GROWTH < 12 HOURS Performed at [facility], [address] [place], [place] [address]    Report Status PENDING   Lactic acid, plasma     Status: Abnormal   Collection Time: 01/28/22  4:21 PM  Result Value Ref Range   Lactic Acid, Venous 2.7 (HH) 0.5 - 1.9 mmol/L  Troponin I (High Sensitivity)     Status: None   Collection Time: 01/28/22  5:49 PM  Result Value Ref Range   Troponin I (High Sensitivity) 6 <18 ng/L  HIV Antibody (routine testing w rflx)     Status: None   Collection Time: 01/28/22  5:49 PM  Result Value Ref Range   HIV Screen 4th  Generation wRfx Non Reactive Non Reactive  Hepatitis panel, acute     Status: Abnormal   Collection Time: 01/28/22  5:49 PM  Result Value Ref Range   Hepatitis B Surface Ag NON REACTIVE NON REACTIVE   HCV Ab NON REACTIVE NON REACTIVE   Hep A IgM NON REACTIVE NON REACTIVE   Hep B C IgM Reactive (A) NON REACTIVE  SARS Coronavirus 2 by RT PCR (hospital order, performed in [place] hospital lab) *cepheid single result test* Anterior Nasal Swab     Status: None   Collection Time: 01/28/22  5:49 PM   Specimen: Anterior Nasal Swab  Result Value Ref Range   SARS Coronavirus 2 by RT PCR NEGATIVE NEGATIVE  MRSA Next Gen by PCR, Nasal     Status: Abnormal   Collection Time: 01/28/22  5:49 PM   Specimen: Nasal Mucosa; Nasal Swab  Result Value Ref Range   MRSA by PCR Next Gen DETECTED (A) NOT DETECTED  Lactic acid, plasma     Status: None   Collection Time: 01/28/22  5:52 PM  Result Value Ref Range   Lactic Acid, Venous 1.6 0.5 - 1.9 mmol/L  Comprehensive metabolic panel     Status: Abnormal   Collection Time: 01/29/22  5:10 AM  Result Value Ref Range   Sodium 136 135 - 145 mmol/L   Potassium 3.8 3.5 - 5.1 mmol/L   Chloride 107 98 - 111 mmol/L   CO2 21 (L) 22 - 32 mmol/L   Glucose, Bld 101 (H) 70 - 99 mg/dL   BUN 11 6 - 20 mg/dL   Creatinine, Ser 0.66 0.61 - 1.24 mg/dL   Calcium 7.7 (L) 8.9 - 10.3 mg/dL   Total Protein 5.9 (L) 6.5 - 8.1 g/dL   Albumin 2.6 (L) 3.5 - 5.0 g/dL   AST 15 15 - 41 U/L   ALT 15 0 - 44 U/L   Alkaline Phosphatase 78 38 - 126 U/L   Total Bilirubin  0.8 0.3 - 1.2 mg/dL   GFR, Estimated >60 >60 mL/min   Anion gap 8 5 - 15  CBC     Status: Abnormal   Collection Time: 01/29/22  5:10 AM  Result Value Ref Range   WBC 11.0 (H) 4.0 - 10.5 K/uL   RBC 3.59 (L) 4.22 - 5.81 MIL/uL   Hemoglobin 11.2 (L) 13.0 - 17.0 g/dL   HCT 33.2 (L) 39.0 - 52.0 %   MCV 92.5 80.0 - 100.0 fL   MCH 31.2 26.0 - 34.0 pg   MCHC 33.7 30.0 - 36.0 g/dL   RDW 12.6 11.5 - 15.5 %   Platelets  206 150 - 400 K/uL   nRBC 0.0 0.0 - 0.2 %    CT HUMERUS LEFT W CONTRAST  Result Date: 01/28/2022 CLINICAL DATA:  Left lower extremity pain and swelling. EXAM: CT OF THE UPPER LEFT HUMERUS WITH CONTRAST CT OF THE UPPER LEFT FOREARM WITH CONTRAST CT OF THE UPPER LEFT HAND WITH CONTRAST TECHNIQUE: Multidetector CT imaging of the left humerus was performed according to the standard protocol following intravenous contrast administration. Multidetector CT imaging of the left forearm was performed according to the standard protocol following intravenous contrast administration. Multidetector CT imaging of the left hand was performed according to the standard protocol following intravenous contrast administration. RADIATION DOSE REDUCTION: This exam was performed according to the departmental dose-optimization program which includes automated exposure control, adjustment of the mA and/or kV according to patient size and/or use of iterative reconstruction technique. CONTRAST:  55mL OMNIPAQUE IOHEXOL 300 MG/ML  SOLN COMPARISON:  None Available. FINDINGS: Bones/Joint/Cartilage No fracture or dislocation. Normal alignment. No joint effusion. Ligaments Ligaments are suboptimally evaluated by CT. Muscles and Tendons 6.7 x 3.8 x 8.5 cm peripherally enhancing complex fluid collection in the flexor carpi radialis and flexor digitorum superficialis muscles most consistent with an intramuscular abscess. Remainder the muscles demonstrate no focal abnormality. Soft tissue No fluid collection or hematoma. No soft tissue mass. Soft tissue edema in the subcutaneous fat involving the volar aspect of the upper arm, circumferentially involving the forearm and dorsal aspect of the hand most concerning for cellulitis. IMPRESSION: 1. Pyomyositis involving the flexor carpi radialis and flexor digitorum superficialis muscles with a 6.7 x 3.8 x 8.5 cm intramuscular abscess. 2. Soft tissue edema in the subcutaneous fat involving the volar  aspect of the upper arm, circumferentially involving the forearm and dorsal aspect of the hand most concerning for cellulitis. Electronically Signed   By: Kathreen Devoid M.D.   On: 01/28/2022 18:36   CT FOREARM LEFT W CONTRAST  Result Date: 01/28/2022 CLINICAL DATA:  Left lower extremity pain and swelling. EXAM: CT OF THE UPPER LEFT HUMERUS WITH CONTRAST CT OF THE UPPER LEFT FOREARM WITH CONTRAST CT OF THE UPPER LEFT HAND WITH CONTRAST TECHNIQUE: Multidetector CT imaging of the left humerus was performed according to the standard protocol following intravenous contrast administration. Multidetector CT imaging of the left forearm was performed according to the standard protocol following intravenous contrast administration. Multidetector CT imaging of the left hand was performed according to the standard protocol following intravenous contrast administration. RADIATION DOSE REDUCTION: This exam was performed according to the departmental dose-optimization program which includes automated exposure control, adjustment of the mA and/or kV according to patient size and/or use of iterative reconstruction technique. CONTRAST:  87mL OMNIPAQUE IOHEXOL 300 MG/ML  SOLN COMPARISON:  None Available. FINDINGS: Bones/Joint/Cartilage No fracture or dislocation. Normal alignment. No joint effusion. Ligaments Ligaments are suboptimally evaluated  by CT. Muscles and Tendons 6.7 x 3.8 x 8.5 cm peripherally enhancing complex fluid collection in the flexor carpi radialis and flexor digitorum superficialis muscles most consistent with an intramuscular abscess. Remainder the muscles demonstrate no focal abnormality. Soft tissue No fluid collection or hematoma. No soft tissue mass. Soft tissue edema in the subcutaneous fat involving the volar aspect of the upper arm, circumferentially involving the forearm and dorsal aspect of the hand most concerning for cellulitis. IMPRESSION: 1. Pyomyositis involving the flexor carpi radialis and flexor  digitorum superficialis muscles with a 6.7 x 3.8 x 8.5 cm intramuscular abscess. 2. Soft tissue edema in the subcutaneous fat involving the volar aspect of the upper arm, circumferentially involving the forearm and dorsal aspect of the hand most concerning for cellulitis. Electronically Signed   By: Elige Ko M.D.   On: 01/28/2022 18:36   CT HAND LEFT W CONTRAST  Result Date: 01/28/2022 CLINICAL DATA:  Left lower extremity pain and swelling. EXAM: CT OF THE UPPER LEFT HUMERUS WITH CONTRAST CT OF THE UPPER LEFT FOREARM WITH CONTRAST CT OF THE UPPER LEFT HAND WITH CONTRAST TECHNIQUE: Multidetector CT imaging of the left humerus was performed according to the standard protocol following intravenous contrast administration. Multidetector CT imaging of the left forearm was performed according to the standard protocol following intravenous contrast administration. Multidetector CT imaging of the left hand was performed according to the standard protocol following intravenous contrast administration. RADIATION DOSE REDUCTION: This exam was performed according to the departmental dose-optimization program which includes automated exposure control, adjustment of the mA and/or kV according to patient size and/or use of iterative reconstruction technique. CONTRAST:  51mL OMNIPAQUE IOHEXOL 300 MG/ML  SOLN COMPARISON:  None Available. FINDINGS: Bones/Joint/Cartilage No fracture or dislocation. Normal alignment. No joint effusion. Ligaments Ligaments are suboptimally evaluated by CT. Muscles and Tendons 6.7 x 3.8 x 8.5 cm peripherally enhancing complex fluid collection in the flexor carpi radialis and flexor digitorum superficialis muscles most consistent with an intramuscular abscess. Remainder the muscles demonstrate no focal abnormality. Soft tissue No fluid collection or hematoma. No soft tissue mass. Soft tissue edema in the subcutaneous fat involving the volar aspect of the upper arm, circumferentially involving the  forearm and dorsal aspect of the hand most concerning for cellulitis. IMPRESSION: 1. Pyomyositis involving the flexor carpi radialis and flexor digitorum superficialis muscles with a 6.7 x 3.8 x 8.5 cm intramuscular abscess. 2. Soft tissue edema in the subcutaneous fat involving the volar aspect of the upper arm, circumferentially involving the forearm and dorsal aspect of the hand most concerning for cellulitis. Electronically Signed   By: Elige Ko M.D.   On: 01/28/2022 18:36   US Venous Img Upper Uni Left  Result Date: 01/28/2022 CLINICAL DATA:  Intermittent chest pain, left arm edema EXAM: LEFT UPPER EXTREMITY VENOUS DOPPLER ULTRASOUND TECHNIQUE: Gray-scale sonography with graded compression, as well as color Doppler and duplex ultrasound were performed to evaluate the upper extremity deep venous system from the level of the subclavian vein and including the jugular, axillary, basilic, radial, ulnar and upper cephalic vein. Spectral Doppler was utilized to evaluate flow at rest and with distal augmentation maneuvers. COMPARISON:  None Available. FINDINGS: Contralateral Subclavian Vein: Respiratory phasicity is normal and symmetric with the symptomatic side. No evidence of thrombus. Normal compressibility. Internal Jugular Vein: No evidence of thrombus. Normal compressibility, respiratory phasicity and response to augmentation. Subclavian Vein: No evidence of thrombus. Normal compressibility, respiratory phasicity and response to augmentation. Axillary Vein: No evidence of  thrombus. Normal compressibility, respiratory phasicity and response to augmentation. Cephalic Vein: Not visualized. Basilic Vein: No evidence of thrombus. Normal compressibility, respiratory phasicity and response to augmentation. Brachial Veins: No evidence of thrombus. Normal compressibility, respiratory phasicity and response to augmentation. Radial Veins: No evidence of thrombus. Normal compressibility, respiratory phasicity and  response to augmentation. Ulnar Veins: No evidence of thrombus. Normal compressibility, respiratory phasicity and response to augmentation. Venous Reflux:  None visualized. Other Findings:  None visualized. IMPRESSION: The left cephalic vein could not be visualized. No evidence of DVT within the remainder of the left upper extremity. Electronically Signed   By: Wiliam Ke M.D.   On: 01/28/2022 17:47   DG Chest 2 View  Result Date: 01/28/2022 CLINICAL DATA:  Left arm edema, intermittent chest pain, fever and chills EXAM: CHEST - 2 VIEW COMPARISON:  07/16/2016 FINDINGS: Frontal and lateral views of the chest demonstrate an unremarkable cardiac silhouette. No acute airspace disease, effusion, or pneumothorax. There are no acute bony abnormalities. IMPRESSION: 1. No acute intrathoracic process. Electronically Signed   By: Sharlet Salina M.D.   On: 01/28/2022 16:58    Assessment/Plan: Day of Surgery   Principal Problem:   Abscess of upper arm and forearm, left Active Problems:   Methamphetamine abuse (HCC)   Polysubstance abuse (HCC)   Sepsis (HCC)  Patient had a large abscess drainage from his volar left forearm today.  Cultures are sent to the lab.  Continue IV antibiotics.  Patient will require a prolonged course of antibiotics.  Reinforce dressing as necessary.    Juanell Fairly , MD 01/29/2022, 3:31 PM

## 2022-01-29 NOTE — Op Note (Addendum)
01/29/2022  2:28 PM  PATIENT:  David Bean    PRE-OPERATIVE DIAGNOSIS:  Left Forearm Abscess  POST-OPERATIVE DIAGNOSIS:  Same  PROCEDURE:  INCISION AND DRAINAGE LEFT VOLAR FOREARM ABSCESS  SURGEON:  Thornton Park, MD  EBL: 5cc  TOURNIQUET TIME:  28 minutes  SPECIMEN:  Culture swab to pathology  ANESTHESIA:   General  PREOPERATIVE INDICATIONS:  [name] is a  46 y.o. male IV drug user presented with pain, swelling and redness in the left forearm.   A CT scan in the ER confirmed a large abscess.  The decision was made to proceed with an incision and drainage of the left forearm abscess given the significant swelling and erythema.  I discussed the risks and benefits of surgery. The risks include but are not limited to persistent or recurrent infection, possibly leading to need for amputation, bleeding, nerve or blood vessel injury, joint stiffness or loss of motion, persistent pain, forearm or hand weakness or numbness, the need for further surgery including repeat I&D versus amputation and death. Patient understood these risks and wished to proceed.   OPERATIVE FINDINGS: Large left volar forearm abscess extending to mid forearm into the antecubital fossa within the anterior compartment  OPERATIVE PROCEDURE: Patient was met in the preoperative area.  The left forearm was marked according to hospital's correct site of surgery protocol.  Patient is already on vancomycin and cefepime.  He was brought to the operating room resting supine on the operative table.  A hand table was used for this case.  Patient had a tourniquet applied to the left upper arm.  Patient was prepped and draped in a sterile fashion.  A timeout was performed to verify the patient's name, date of birth, medical record number, correct site of surgery and correct procedure to be performed.  Once all in attendance were in agreement the case began.  A left arm tourniquet was inflated to 250 mmHg.  The arm was  however was not exsanguinated due to the patient's infection.  A longitudinal 6 cm incision was made over the volar proximal forearm centered over the area of fluctuance.  The skin was incised with a #15 blade.  A Metzenbaum scissor was used to dissect the subcutaneous tissue and opened the fascia.  A large amount of purulent fluid poured out.  This was swabbed for culture.  The subcutaneous tissue fascia was opened further taking care to avoid any injury to neurovascular structures.  Digital dissection was performed to open the interval between the flexor carpi radialis and flexor digitorum superficialis muscles.  A pulse lavage was used to irrigate 6 L of GU infused saline to thoroughly the volar forearm.    The incision was then approximated with 2-0 undyed PDS suture and 4-0 nylon.  A dry sterile dressing was applied.  The tourniquet was deflated.  The patient was then awoken and brought to the PACU in stable condition.  I scrubbed and present for the entire case.  All sharp, sponge and instrument counts were correct at the conclusion of the case.

## 2022-01-29 NOTE — Progress Notes (Signed)
Subjective:  Very agitated and uncooperative patient.  Refusing to change into a gown for surgery.  IV drug user last injected arm 6 days ago.  CT shows:  Pyomyositis involving the flexor carpi radialis and flexor digitorum superficialis muscles with a 6.7 x 3.8 x 8.5 cm intramuscular abscess.  This case is emergent given the high risk for spread of the infection which could result in loss to the left upper extremity.  WBC 11.  On cefepime and vanco IV.      Objective:   VITALS:   Vitals:   01/29/22 0322 01/29/22 0500 01/29/22 0827 01/29/22 1227  BP: 119/84  128/81 127/81  Pulse: 88  85 77  Resp: 15   16  Temp: 98.2 F (36.8 C)  99.5 F (37.5 C) 98.7 F (37.1 C)  TempSrc:    Temporal  SpO2: 99%  100% 99%  Weight:  76 kg  76 kg  Height:    5\' 9"  (1.753 m)    PHYSICAL EXAM: Left arm extremity: Erythema and large area of swelling over the proximal volar forearm.  NVI.  No evidence of compartment syndrome. Tender over volar proximal forearm.     LABS  Results for orders placed or performed during the hospital encounter of 01/28/22 (from the past 24 hour(s))  Culture, blood (routine x 2)     Status: None (Preliminary result)   Collection Time: 01/28/22  4:09 PM   Specimen: BLOOD  Result Value Ref Range   Specimen Description BLOOD RIGHT ANTECUBITAL    Special Requests      BOTTLES DRAWN AEROBIC AND ANAEROBIC Blood Culture adequate volume   Culture      NO GROWTH < 12 HOURS Performed at [facility], [address] [place], [place]    Report Status PENDING   Comprehensive metabolic panel     Status: Abnormal   Collection Time: 01/28/22  4:10 PM  Result Value Ref Range   Sodium 136 135 - 145 mmol/L   Potassium 3.7 3.5 - 5.1 mmol/L   Chloride 101 98 - 111 mmol/L   CO2 28 22 - 32 mmol/L   Glucose, Bld 91 70 - 99 mg/dL   BUN 10 6 - 20 mg/dL   Creatinine, Ser 01/30/22 0.61 - 1.24 mg/dL   Calcium 8.2 (L) 8.9 - 10.3 mg/dL   Total Protein 7.0 6.5 - 8.1 g/dL    Albumin 3.2 (L) 3.5 - 5.0 g/dL   AST 17 15 - 41 U/L   ALT 18 0 - 44 U/L   Alkaline Phosphatase 87 38 - 126 U/L   Total Bilirubin 0.5 0.3 - 1.2 mg/dL   GFR, Estimated 0.62 >69 mL/min   Anion gap 7 5 - 15  Troponin I (High Sensitivity)     Status: None   Collection Time: 01/28/22  4:10 PM  Result Value Ref Range   Troponin I (High Sensitivity) 5 <18 ng/L  CBC with Differential     Status: Abnormal   Collection Time: 01/28/22  4:10 PM  Result Value Ref Range   WBC 11.7 (H) 4.0 - 10.5 K/uL   RBC 4.17 (L) 4.22 - 5.81 MIL/uL   Hemoglobin 12.7 (L) 13.0 - 17.0 g/dL   HCT 01/30/22 (L) 54.6 - 27.0 %   MCV 92.8 80.0 - 100.0 fL   MCH 30.5 26.0 - 34.0 pg   MCHC 32.8 30.0 - 36.0 g/dL   RDW 35.0 09.3 - 81.8 %   Platelets 225 150 -  400 K/uL   nRBC 0.0 0.0 - 0.2 %   Neutrophils Relative % 78 %   Neutro Abs 9.1 (H) 1.7 - 7.7 K/uL   Lymphocytes Relative 10 %   Lymphs Abs 1.2 0.7 - 4.0 K/uL   Monocytes Relative 11 %   Monocytes Absolute 1.2 (H) 0.1 - 1.0 K/uL   Eosinophils Relative 1 %   Eosinophils Absolute 0.1 0.0 - 0.5 K/uL   Basophils Relative 0 %   Basophils Absolute 0.0 0.0 - 0.1 K/uL   Immature Granulocytes 0 %   Abs Immature Granulocytes 0.04 0.00 - 0.07 K/uL  Culture, blood (routine x 2)     Status: None (Preliminary result)   Collection Time: 01/28/22  4:16 PM   Specimen: BLOOD  Result Value Ref Range   Specimen Description BLOOD BLOOD RIGHT HAND    Special Requests      BOTTLES DRAWN AEROBIC ONLY Blood Culture results may not be optimal due to an inadequate volume of blood received in culture bottles   Culture      NO GROWTH < 12 HOURS Performed at [facility], [address] [place], [place] [address]    Report Status PENDING   Lactic acid, plasma     Status: Abnormal   Collection Time: 01/28/22  4:21 PM  Result Value Ref Range   Lactic Acid, Venous 2.7 (HH) 0.5 - 1.9 mmol/L  Troponin I (High Sensitivity)     Status: None   Collection Time: 01/28/22  5:49 PM   Result Value Ref Range   Troponin I (High Sensitivity) 6 <18 ng/L  HIV Antibody (routine testing w rflx)     Status: None   Collection Time: 01/28/22  5:49 PM  Result Value Ref Range   HIV Screen 4th Generation wRfx Non Reactive Non Reactive  Hepatitis panel, acute     Status: Abnormal   Collection Time: 01/28/22  5:49 PM  Result Value Ref Range   Hepatitis B Surface Ag NON REACTIVE NON REACTIVE   HCV Ab NON REACTIVE NON REACTIVE   Hep A IgM NON REACTIVE NON REACTIVE   Hep B C IgM Reactive (A) NON REACTIVE  SARS Coronavirus 2 by RT PCR (hospital order, performed in [facility] hospital lab) *cepheid single result test* Anterior Nasal Swab     Status: None   Collection Time: 01/28/22  5:49 PM   Specimen: Anterior Nasal Swab  Result Value Ref Range   SARS Coronavirus 2 by RT PCR NEGATIVE NEGATIVE  MRSA Next Gen by PCR, Nasal     Status: Abnormal   Collection Time: 01/28/22  5:49 PM   Specimen: Nasal Mucosa; Nasal Swab  Result Value Ref Range   MRSA by PCR Next Gen DETECTED (A) NOT DETECTED  Lactic acid, plasma     Status: None   Collection Time: 01/28/22  5:52 PM  Result Value Ref Range   Lactic Acid, Venous 1.6 0.5 - 1.9 mmol/L  Comprehensive metabolic panel     Status: Abnormal   Collection Time: 01/29/22  5:10 AM  Result Value Ref Range   Sodium 136 135 - 145 mmol/L   Potassium 3.8 3.5 - 5.1 mmol/L   Chloride 107 98 - 111 mmol/L   CO2 21 (L) 22 - 32 mmol/L   Glucose, Bld 101 (H) 70 - 99 mg/dL   BUN 11 6 - 20 mg/dL   Creatinine, Ser 0.08 0.61 - 1.24 mg/dL   Calcium 7.7 (L) 8.9 - 10.3 mg/dL  Total Protein 5.9 (L) 6.5 - 8.1 g/dL   Albumin 2.6 (L) 3.5 - 5.0 g/dL   AST 15 15 - 41 U/L   ALT 15 0 - 44 U/L   Alkaline Phosphatase 78 38 - 126 U/L   Total Bilirubin 0.8 0.3 - 1.2 mg/dL   GFR, Estimated >16>60 >10>60 mL/min   Anion gap 8 5 - 15  CBC     Status: Abnormal   Collection Time: 01/29/22  5:10 AM  Result Value Ref Range   WBC 11.0 (H) 4.0 - 10.5 K/uL   RBC 3.59 (L) 4.22 -  5.81 MIL/uL   Hemoglobin 11.2 (L) 13.0 - 17.0 g/dL   HCT 96.033.2 (L) 45.439.0 - 09.852.0 %   MCV 92.5 80.0 - 100.0 fL   MCH 31.2 26.0 - 34.0 pg   MCHC 33.7 30.0 - 36.0 g/dL   RDW 11.912.6 14.711.5 - 82.915.5 %   Platelets 206 150 - 400 K/uL   nRBC 0.0 0.0 - 0.2 %    CT HUMERUS LEFT W CONTRAST  Result Date: 01/28/2022 CLINICAL DATA:  Left lower extremity pain and swelling. EXAM: CT OF THE UPPER LEFT HUMERUS WITH CONTRAST CT OF THE UPPER LEFT FOREARM WITH CONTRAST CT OF THE UPPER LEFT HAND WITH CONTRAST TECHNIQUE: Multidetector CT imaging of the left humerus was performed according to the standard protocol following intravenous contrast administration. Multidetector CT imaging of the left forearm was performed according to the standard protocol following intravenous contrast administration. Multidetector CT imaging of the left hand was performed according to the standard protocol following intravenous contrast administration. RADIATION DOSE REDUCTION: This exam was performed according to the departmental dose-optimization program which includes automated exposure control, adjustment of the mA and/or kV according to patient size and/or use of iterative reconstruction technique. CONTRAST:  80mL OMNIPAQUE IOHEXOL 300 MG/ML  SOLN COMPARISON:  None Available. FINDINGS: Bones/Joint/Cartilage No fracture or dislocation. Normal alignment. No joint effusion. Ligaments Ligaments are suboptimally evaluated by CT. Muscles and Tendons 6.7 x 3.8 x 8.5 cm peripherally enhancing complex fluid collection in the flexor carpi radialis and flexor digitorum superficialis muscles most consistent with an intramuscular abscess. Remainder the muscles demonstrate no focal abnormality. Soft tissue No fluid collection or hematoma. No soft tissue mass. Soft tissue edema in the subcutaneous fat involving the volar aspect of the upper arm, circumferentially involving the forearm and dorsal aspect of the hand most concerning for cellulitis. IMPRESSION: 1.  Pyomyositis involving the flexor carpi radialis and flexor digitorum superficialis muscles with a 6.7 x 3.8 x 8.5 cm intramuscular abscess. 2. Soft tissue edema in the subcutaneous fat involving the volar aspect of the upper arm, circumferentially involving the forearm and dorsal aspect of the hand most concerning for cellulitis. Electronically Signed   By: Elige KoHetal  Patel M.D.   On: 01/28/2022 18:36   CT FOREARM LEFT W CONTRAST  Result Date: 01/28/2022 CLINICAL DATA:  Left lower extremity pain and swelling. EXAM: CT OF THE UPPER LEFT HUMERUS WITH CONTRAST CT OF THE UPPER LEFT FOREARM WITH CONTRAST CT OF THE UPPER LEFT HAND WITH CONTRAST TECHNIQUE: Multidetector CT imaging of the left humerus was performed according to the standard protocol following intravenous contrast administration. Multidetector CT imaging of the left forearm was performed according to the standard protocol following intravenous contrast administration. Multidetector CT imaging of the left hand was performed according to the standard protocol following intravenous contrast administration. RADIATION DOSE REDUCTION: This exam was performed according to the departmental dose-optimization program which includes automated exposure  control, adjustment of the mA and/or kV according to patient size and/or use of iterative reconstruction technique. CONTRAST:  5mL OMNIPAQUE IOHEXOL 300 MG/ML  SOLN COMPARISON:  None Available. FINDINGS: Bones/Joint/Cartilage No fracture or dislocation. Normal alignment. No joint effusion. Ligaments Ligaments are suboptimally evaluated by CT. Muscles and Tendons 6.7 x 3.8 x 8.5 cm peripherally enhancing complex fluid collection in the flexor carpi radialis and flexor digitorum superficialis muscles most consistent with an intramuscular abscess. Remainder the muscles demonstrate no focal abnormality. Soft tissue No fluid collection or hematoma. No soft tissue mass. Soft tissue edema in the subcutaneous fat involving the  volar aspect of the upper arm, circumferentially involving the forearm and dorsal aspect of the hand most concerning for cellulitis. IMPRESSION: 1. Pyomyositis involving the flexor carpi radialis and flexor digitorum superficialis muscles with a 6.7 x 3.8 x 8.5 cm intramuscular abscess. 2. Soft tissue edema in the subcutaneous fat involving the volar aspect of the upper arm, circumferentially involving the forearm and dorsal aspect of the hand most concerning for cellulitis. Electronically Signed   By: Elige Ko M.D.   On: 01/28/2022 18:36   CT HAND LEFT W CONTRAST  Result Date: 01/28/2022 CLINICAL DATA:  Left lower extremity pain and swelling. EXAM: CT OF THE UPPER LEFT HUMERUS WITH CONTRAST CT OF THE UPPER LEFT FOREARM WITH CONTRAST CT OF THE UPPER LEFT HAND WITH CONTRAST TECHNIQUE: Multidetector CT imaging of the left humerus was performed according to the standard protocol following intravenous contrast administration. Multidetector CT imaging of the left forearm was performed according to the standard protocol following intravenous contrast administration. Multidetector CT imaging of the left hand was performed according to the standard protocol following intravenous contrast administration. RADIATION DOSE REDUCTION: This exam was performed according to the departmental dose-optimization program which includes automated exposure control, adjustment of the mA and/or kV according to patient size and/or use of iterative reconstruction technique. CONTRAST:  62mL OMNIPAQUE IOHEXOL 300 MG/ML  SOLN COMPARISON:  None Available. FINDINGS: Bones/Joint/Cartilage No fracture or dislocation. Normal alignment. No joint effusion. Ligaments Ligaments are suboptimally evaluated by CT. Muscles and Tendons 6.7 x 3.8 x 8.5 cm peripherally enhancing complex fluid collection in the flexor carpi radialis and flexor digitorum superficialis muscles most consistent with an intramuscular abscess. Remainder the muscles demonstrate  no focal abnormality. Soft tissue No fluid collection or hematoma. No soft tissue mass. Soft tissue edema in the subcutaneous fat involving the volar aspect of the upper arm, circumferentially involving the forearm and dorsal aspect of the hand most concerning for cellulitis. IMPRESSION: 1. Pyomyositis involving the flexor carpi radialis and flexor digitorum superficialis muscles with a 6.7 x 3.8 x 8.5 cm intramuscular abscess. 2. Soft tissue edema in the subcutaneous fat involving the volar aspect of the upper arm, circumferentially involving the forearm and dorsal aspect of the hand most concerning for cellulitis. Electronically Signed   By: Elige Ko M.D.   On: 01/28/2022 18:36   US Venous Img Upper Uni Left  Result Date: 01/28/2022 CLINICAL DATA:  Intermittent chest pain, left arm edema EXAM: LEFT UPPER EXTREMITY VENOUS DOPPLER ULTRASOUND TECHNIQUE: Gray-scale sonography with graded compression, as well as color Doppler and duplex ultrasound were performed to evaluate the upper extremity deep venous system from the level of the subclavian vein and including the jugular, axillary, basilic, radial, ulnar and upper cephalic vein. Spectral Doppler was utilized to evaluate flow at rest and with distal augmentation maneuvers. COMPARISON:  None Available. FINDINGS: Contralateral Subclavian Vein: Respiratory phasicity is normal  and symmetric with the symptomatic side. No evidence of thrombus. Normal compressibility. Internal Jugular Vein: No evidence of thrombus. Normal compressibility, respiratory phasicity and response to augmentation. Subclavian Vein: No evidence of thrombus. Normal compressibility, respiratory phasicity and response to augmentation. Axillary Vein: No evidence of thrombus. Normal compressibility, respiratory phasicity and response to augmentation. Cephalic Vein: Not visualized. Basilic Vein: No evidence of thrombus. Normal compressibility, respiratory phasicity and response to augmentation.  Brachial Veins: No evidence of thrombus. Normal compressibility, respiratory phasicity and response to augmentation. Radial Veins: No evidence of thrombus. Normal compressibility, respiratory phasicity and response to augmentation. Ulnar Veins: No evidence of thrombus. Normal compressibility, respiratory phasicity and response to augmentation. Venous Reflux:  None visualized. Other Findings:  None visualized. IMPRESSION: The left cephalic vein could not be visualized. No evidence of DVT within the remainder of the left upper extremity. Electronically Signed   By: Wiliam Ke M.D.   On: 01/28/2022 17:47   DG Chest 2 View  Result Date: 01/28/2022 CLINICAL DATA:  Left arm edema, intermittent chest pain, fever and chills EXAM: CHEST - 2 VIEW COMPARISON:  07/16/2016 FINDINGS: Frontal and lateral views of the chest demonstrate an unremarkable cardiac silhouette. No acute airspace disease, effusion, or pneumothorax. There are no acute bony abnormalities. IMPRESSION: 1. No acute intrathoracic process. Electronically Signed   By: Sharlet Salina M.D.   On: 01/28/2022 16:58    Assessment/Plan: Day of Surgery   Principal Problem:   Abscess of upper arm and forearm, left Active Problems:   Methamphetamine abuse (HCC)   Polysubstance abuse (HCC)   Sepsis (HCC)   Plan for surgical incision and drainage of the left upper extremity.  I have explained the risks of surgery.  He is aware that without surgical I&D and appropriate antibiotics he is at risk for loss of limb or death.   Juanell Fairly , MD 2022-02-03, 12:39 PM

## 2022-01-29 NOTE — Progress Notes (Addendum)
AC speaking to patient, due to patient attitude to staff, and lack of compliance.

## 2022-01-29 NOTE — Progress Notes (Signed)
PROGRESS NOTE    David Bean  [id number] DOB: 02-24-1976 DOA: 01/28/2022 PCP: Patient, No Pcp Per  Outpatient Specialists: none    Brief Narrative:  From admission h and p David Bean is a 46 y.o. male seen in ed with complaints of coming to [place] for a large abscess in his left forearm is IV drug user ,nontraumatic, pain, redness, and swelling of left arm over the last 5 days. The pain and swelling and redness started in his arm and is now extended to his left hand.Pt is pleasant and cooperative. Want to eat and say one sandwich is not enough for him. Gives limited history.    Assessment & Plan:   Principal Problem:   Abscess of upper arm and forearm, left Active Problems:   Sepsis ([place])   Polysubstance abuse ([place])   Methamphetamine abuse ([place])  # Left arm pyomyositis 2/2 IV drug use. Hemodynamically stable. No signs nec fasciitis. - ortho has seen, plan for I and D today - cont vanc/cefepime, start flagyl for anaerobic coverage given IV drug user - f/u blood culture - f/u surgical cultures - tdap ordered  # Polysubstance abuse # IV drug use - uds pending - monitor for s/s withdrawal - f/u hiv, rpr, quant gold. Hcv antibody negative  # Hep B core igm positive With negative surface antigen. No s/s acute hepatitis - will discuss w/ ID    DVT prophylaxis: SCDs Code Status: full Family Communication: none @ bedside. Patient says he has not close contacts or family  Level of care: Telemetry Medical Status is: Inpatient Remains inpatient appropriate because: IV antibiotics, need for surgery    Consultants:  ortho  Procedures: pending  Antimicrobials:  Vanc/cefepime>vanc/flagyl/cefepime   Subjective: Today says left arm hurts, then curses at me, refuses to answer other questions  Objective: Vitals:   01/28/22 2309 01/29/22 0322 01/29/22 0500 01/29/22 0827  BP: (!) 125/92 119/84  128/81  Pulse: 80 88  85  Resp: 15 15    Temp: 98.8 F (37.1  C) 98.2 F (36.8 C)  99.5 F (37.5 C)  TempSrc:      SpO2: 97% 99%  100%  Weight:   76 kg   Height:        Intake/Output Summary (Last 24 hours) at 01/29/2022 0939 Last data filed at 01/29/2022 0646 Gross per 24 hour  Intake 393.69 ml  Output --  Net 393.69 ml   Filed Weights   01/28/22 1550 01/29/22 0500  Weight: 79.4 kg 76 kg    Examination:  General exam: Appears calm and comfortable. dissheveled Respiratory system: refuses Cardiovascular system: refuses Gastrointestinal system: refuses Central nervous system: moving all 4 ext Extremities: left arm erythematous and swollen Skin: see above Psychiatry: hostile affect    Data Reviewed: I have personally reviewed following labs and imaging studies  CBC: Recent Labs  Lab 01/28/22 1610 01/29/22 0510  WBC 11.7* 11.0*  NEUTROABS 9.1*  --   HGB 12.7* 11.2*  HCT 38.7* 33.2*  MCV 92.8 92.5  PLT 225 [id number]   Basic Metabolic Panel: Recent Labs  Lab 01/28/22 1610 01/29/22 0510  NA 136 136  K 3.7 3.8  CL 101 107  CO2 28 21*  GLUCOSE 91 101*  BUN 10 11  CREATININE 0.79 0.66  CALCIUM 8.2* 7.7*   GFR: Estimated Creatinine Clearance: 115.4 mL/min (by C-G formula based on SCr of 0.66 mg/dL). Liver Function Tests: Recent Labs  Lab 01/28/22 1610 01/29/22 0510  AST 17 15  ALT 18 15  ALKPHOS 87 78  BILITOT 0.5 0.8  PROT 7.0 5.9*  ALBUMIN 3.2* 2.6*   No results for input(s): "LIPASE", "AMYLASE" in the last 168 hours. No results for input(s): "AMMONIA" in the last 168 hours. Coagulation Profile: No results for input(s): "INR", "PROTIME" in the last 168 hours. Cardiac Enzymes: No results for input(s): "CKTOTAL", "CKMB", "CKMBINDEX", "TROPONINI" in the last 168 hours. BNP (last 3 results) No results for input(s): "PROBNP" in the last 8760 hours. HbA1C: No results for input(s): "HGBA1C" in the last 72 hours. CBG: No results for input(s): "GLUCAP" in the last 168 hours. Lipid Profile: No results for input(s):  "CHOL", "HDL", "LDLCALC", "TRIG", "CHOLHDL", "LDLDIRECT" in the last 72 hours. Thyroid Function Tests: No results for input(s): "TSH", "T4TOTAL", "FREET4", "T3FREE", "THYROIDAB" in the last 72 hours. Anemia Panel: No results for input(s): "VITAMINB12", "FOLATE", "FERRITIN", "TIBC", "IRON", "RETICCTPCT" in the last 72 hours. Urine analysis:    Component Value Date/Time   COLORURINE Yellow 12/04/2012 1052   APPEARANCEUR Cloudy 12/04/2012 1052   LABSPEC 1.015 12/04/2012 1052   PHURINE 9.0 12/04/2012 1052   GLUCOSEU Negative 12/04/2012 1052   HGBUR Negative 12/04/2012 1052   BILIRUBINUR Negative 12/04/2012 1052   KETONESUR Negative 12/04/2012 1052   PROTEINUR Negative 12/04/2012 1052   NITRITE Negative 12/04/2012 1052   LEUKOCYTESUR Negative 12/04/2012 1052   Sepsis Labs: @LABRCNTIP (procalcitonin:4,lacticidven:4)  ) Recent Results (from the past 240 hour(s))  Culture, blood (routine x 2)     Status: None (Preliminary result)   Collection Time: 01/28/22  4:09 PM   Specimen: BLOOD  Result Value Ref Range Status   Specimen Description BLOOD RIGHT ANTECUBITAL  Final   Special Requests   Final    BOTTLES DRAWN AEROBIC AND ANAEROBIC Blood Culture adequate volume   Culture   Final    NO GROWTH < 12 HOURS Performed at [facility], [address], [place], [place]    Report Status PENDING  Incomplete  Culture, blood (routine x 2)     Status: None (Preliminary result)   Collection Time: 01/28/22  4:16 PM   Specimen: BLOOD  Result Value Ref Range Status   Specimen Description BLOOD BLOOD RIGHT HAND  Final   Special Requests   Final    BOTTLES DRAWN AEROBIC ONLY Blood Culture results may not be optimal due to an inadequate volume of blood received in culture bottles   Culture   Final    NO GROWTH < 12 HOURS Performed at [facility], [address], [place], [place]    Report Status PENDING  Incomplete  SARS Coronavirus 2 by RT PCR (hospital  order, performed in [facility] hospital lab) *cepheid single result test* Anterior Nasal Swab     Status: None   Collection Time: 01/28/22  5:49 PM   Specimen: Anterior Nasal Swab  Result Value Ref Range Status   SARS Coronavirus 2 by RT PCR NEGATIVE NEGATIVE Final    Comment: (NOTE) SARS-CoV-2 target nucleic acids are NOT DETECTED.  The SARS-CoV-2 RNA is generally detectable in upper and lower respiratory specimens during the acute phase of infection. The lowest concentration of SARS-CoV-2 viral copies this assay can detect is 250 copies / mL. A negative result does not preclude SARS-CoV-2 infection and should not be used as the sole basis for treatment or other patient management decisions.  A negative result may occur with improper specimen collection / handling, submission of specimen other than nasopharyngeal swab, presence of  viral mutation(s) within the areas targeted by this assay, and inadequate number of viral copies (<250 copies / mL). A negative result must be combined with clinical observations, patient history, and epidemiological information.  Fact Sheet for Patients:   https://www.patel.info/  Fact Sheet for Healthcare Providers: https://hall.com/  This test is not yet approved or  cleared by the [place] FDA and has been authorized for detection and/or diagnosis of SARS-CoV-2 by FDA under an Emergency Use Authorization (EUA).  This EUA will remain in effect (meaning this test can be used) for the duration of the COVID-19 declaration under Section 564(b)(1) of the Act, 21 U.S.C. section 360bbb-3(b)(1), unless the authorization is terminated or revoked sooner.  Performed at [facility], [address] [place], [place] [address]   MRSA Next Gen by PCR, Nasal     Status: Abnormal   Collection Time: 01/28/22  5:49 PM   Specimen: Nasal Mucosa; Nasal Swab  Result Value Ref Range Status   MRSA by PCR Next  Gen DETECTED (A) NOT DETECTED Final    Comment: CRITICAL RESULT CALLED TO, READ BACK BY AND VERIFIED WITH: RACHAEL MERKLE @ 2008 ON 01/28/2022.Marland KitchenMarland KitchenTKR (NOTE) The GeneXpert MRSA Assay (FDA approved for NASAL specimens only), is one component of a comprehensive MRSA colonization surveillance program. It is not intended to diagnose MRSA infection nor to guide or monitor treatment for MRSA infections. Test performance is not FDA approved in patients less than 2 years old. Performed at [facility], [address] [place], [place] [address]          Radiology Studies: CT HUMERUS LEFT W CONTRAST  Result Date: 01/28/2022 CLINICAL DATA:  Left lower extremity pain and swelling. EXAM: CT OF THE UPPER LEFT HUMERUS WITH CONTRAST CT OF THE UPPER LEFT FOREARM WITH CONTRAST CT OF THE UPPER LEFT HAND WITH CONTRAST TECHNIQUE: Multidetector CT imaging of the left humerus was performed according to the standard protocol following intravenous contrast administration. Multidetector CT imaging of the left forearm was performed according to the standard protocol following intravenous contrast administration. Multidetector CT imaging of the left hand was performed according to the standard protocol following intravenous contrast administration. RADIATION DOSE REDUCTION: This exam was performed according to the departmental dose-optimization program which includes automated exposure control, adjustment of the mA and/or kV according to patient size and/or use of iterative reconstruction technique. CONTRAST:  40mL OMNIPAQUE IOHEXOL 300 MG/ML  SOLN COMPARISON:  None Available. FINDINGS: Bones/Joint/Cartilage No fracture or dislocation. Normal alignment. No joint effusion. Ligaments Ligaments are suboptimally evaluated by CT. Muscles and Tendons 6.7 x 3.8 x 8.5 cm peripherally enhancing complex fluid collection in the flexor carpi radialis and flexor digitorum superficialis muscles most consistent with an  intramuscular abscess. Remainder the muscles demonstrate no focal abnormality. Soft tissue No fluid collection or hematoma. No soft tissue mass. Soft tissue edema in the subcutaneous fat involving the volar aspect of the upper arm, circumferentially involving the forearm and dorsal aspect of the hand most concerning for cellulitis. IMPRESSION: 1. Pyomyositis involving the flexor carpi radialis and flexor digitorum superficialis muscles with a 6.7 x 3.8 x 8.5 cm intramuscular abscess. 2. Soft tissue edema in the subcutaneous fat involving the volar aspect of the upper arm, circumferentially involving the forearm and dorsal aspect of the hand most concerning for cellulitis. Electronically Signed   By: Kathreen Devoid M.D.   On: 01/28/2022 18:36   CT FOREARM LEFT W CONTRAST  Result Date: 01/28/2022 CLINICAL DATA:  Left lower extremity pain  and swelling. EXAM: CT OF THE UPPER LEFT HUMERUS WITH CONTRAST CT OF THE UPPER LEFT FOREARM WITH CONTRAST CT OF THE UPPER LEFT HAND WITH CONTRAST TECHNIQUE: Multidetector CT imaging of the left humerus was performed according to the standard protocol following intravenous contrast administration. Multidetector CT imaging of the left forearm was performed according to the standard protocol following intravenous contrast administration. Multidetector CT imaging of the left hand was performed according to the standard protocol following intravenous contrast administration. RADIATION DOSE REDUCTION: This exam was performed according to the departmental dose-optimization program which includes automated exposure control, adjustment of the mA and/or kV according to patient size and/or use of iterative reconstruction technique. CONTRAST:  53mL OMNIPAQUE IOHEXOL 300 MG/ML  SOLN COMPARISON:  None Available. FINDINGS: Bones/Joint/Cartilage No fracture or dislocation. Normal alignment. No joint effusion. Ligaments Ligaments are suboptimally evaluated by CT. Muscles and Tendons 6.7 x 3.8 x 8.5  cm peripherally enhancing complex fluid collection in the flexor carpi radialis and flexor digitorum superficialis muscles most consistent with an intramuscular abscess. Remainder the muscles demonstrate no focal abnormality. Soft tissue No fluid collection or hematoma. No soft tissue mass. Soft tissue edema in the subcutaneous fat involving the volar aspect of the upper arm, circumferentially involving the forearm and dorsal aspect of the hand most concerning for cellulitis. IMPRESSION: 1. Pyomyositis involving the flexor carpi radialis and flexor digitorum superficialis muscles with a 6.7 x 3.8 x 8.5 cm intramuscular abscess. 2. Soft tissue edema in the subcutaneous fat involving the volar aspect of the upper arm, circumferentially involving the forearm and dorsal aspect of the hand most concerning for cellulitis. Electronically Signed   By: Elige Ko M.D.   On: 01/28/2022 18:36   CT HAND LEFT W CONTRAST  Result Date: 01/28/2022 CLINICAL DATA:  Left lower extremity pain and swelling. EXAM: CT OF THE UPPER LEFT HUMERUS WITH CONTRAST CT OF THE UPPER LEFT FOREARM WITH CONTRAST CT OF THE UPPER LEFT HAND WITH CONTRAST TECHNIQUE: Multidetector CT imaging of the left humerus was performed according to the standard protocol following intravenous contrast administration. Multidetector CT imaging of the left forearm was performed according to the standard protocol following intravenous contrast administration. Multidetector CT imaging of the left hand was performed according to the standard protocol following intravenous contrast administration. RADIATION DOSE REDUCTION: This exam was performed according to the departmental dose-optimization program which includes automated exposure control, adjustment of the mA and/or kV according to patient size and/or use of iterative reconstruction technique. CONTRAST:  53mL OMNIPAQUE IOHEXOL 300 MG/ML  SOLN COMPARISON:  None Available. FINDINGS: Bones/Joint/Cartilage No fracture  or dislocation. Normal alignment. No joint effusion. Ligaments Ligaments are suboptimally evaluated by CT. Muscles and Tendons 6.7 x 3.8 x 8.5 cm peripherally enhancing complex fluid collection in the flexor carpi radialis and flexor digitorum superficialis muscles most consistent with an intramuscular abscess. Remainder the muscles demonstrate no focal abnormality. Soft tissue No fluid collection or hematoma. No soft tissue mass. Soft tissue edema in the subcutaneous fat involving the volar aspect of the upper arm, circumferentially involving the forearm and dorsal aspect of the hand most concerning for cellulitis. IMPRESSION: 1. Pyomyositis involving the flexor carpi radialis and flexor digitorum superficialis muscles with a 6.7 x 3.8 x 8.5 cm intramuscular abscess. 2. Soft tissue edema in the subcutaneous fat involving the volar aspect of the upper arm, circumferentially involving the forearm and dorsal aspect of the hand most concerning for cellulitis. Electronically Signed   By: Elige Ko M.D.   On:  01/28/2022 18:36   US Venous Img Upper Uni Left  Result Date: 01/28/2022 CLINICAL DATA:  Intermittent chest pain, left arm edema EXAM: LEFT UPPER EXTREMITY VENOUS DOPPLER ULTRASOUND TECHNIQUE: Gray-scale sonography with graded compression, as well as color Doppler and duplex ultrasound were performed to evaluate the upper extremity deep venous system from the level of the subclavian vein and including the jugular, axillary, basilic, radial, ulnar and upper cephalic vein. Spectral Doppler was utilized to evaluate flow at rest and with distal augmentation maneuvers. COMPARISON:  None Available. FINDINGS: Contralateral Subclavian Vein: Respiratory phasicity is normal and symmetric with the symptomatic side. No evidence of thrombus. Normal compressibility. Internal Jugular Vein: No evidence of thrombus. Normal compressibility, respiratory phasicity and response to augmentation. Subclavian Vein: No evidence of  thrombus. Normal compressibility, respiratory phasicity and response to augmentation. Axillary Vein: No evidence of thrombus. Normal compressibility, respiratory phasicity and response to augmentation. Cephalic Vein: Not visualized. Basilic Vein: No evidence of thrombus. Normal compressibility, respiratory phasicity and response to augmentation. Brachial Veins: No evidence of thrombus. Normal compressibility, respiratory phasicity and response to augmentation. Radial Veins: No evidence of thrombus. Normal compressibility, respiratory phasicity and response to augmentation. Ulnar Veins: No evidence of thrombus. Normal compressibility, respiratory phasicity and response to augmentation. Venous Reflux:  None visualized. Other Findings:  None visualized. IMPRESSION: The left cephalic vein could not be visualized. No evidence of DVT within the remainder of the left upper extremity. Electronically Signed   By: Merilyn Baba M.D.   On: 01/28/2022 17:47   DG Chest 2 View  Result Date: 01/28/2022 CLINICAL DATA:  Left arm edema, intermittent chest pain, fever and chills EXAM: CHEST - 2 VIEW COMPARISON:  07/16/2016 FINDINGS: Frontal and lateral views of the chest demonstrate an unremarkable cardiac silhouette. No acute airspace disease, effusion, or pneumothorax. There are no acute bony abnormalities. IMPRESSION: 1. No acute intrathoracic process. Electronically Signed   By: Randa Ngo M.D.   On: 01/28/2022 16:58        Scheduled Meds:  nicotine  21 mg Transdermal Daily   sodium chloride flush  3 mL Intravenous Q12H   Tdap  0.5 mL Intramuscular Once   thiamine  100 mg Oral Daily   Continuous Infusions:  sodium chloride 100 mL/hr at 01/29/22 0646   ceFEPime (MAXIPIME) IV Stopped (01/29/22 [id number])   vancomycin 1,500 mg (01/29/22 0809)     LOS: 1 day     Desma Maxim, MD [facility]   If 7PM-7AM, please contact night-coverage [email or web address] Password [facility] 01/29/2022, 9:39 AM

## 2022-01-29 NOTE — Transfer of Care (Signed)
Immediate Anesthesia Transfer of Care Note  Patient: David Bean  Procedure(s) Performed: INCISION AND DRAINAGE ABSCESS (Left: Arm Upper)  Patient Location: PACU  Anesthesia Type:General  Level of Consciousness: drowsy  Airway & Oxygen Therapy: Patient Spontanous Breathing and Patient connected to face mask oxygen  Post-op Assessment: Report given to RN and Post -op Vital signs reviewed and stable  Post vital signs: Reviewed and stable  Last Vitals:  Vitals Value Taken Time  BP 107/75 01/29/22 1422  Temp 36.6 C 01/29/22 1422  Pulse 77 01/29/22 1427  Resp 20 01/29/22 1427  SpO2 98 % 01/29/22 1427  Vitals shown include unvalidated device data.  Last Pain:  Vitals:   01/29/22 1422  TempSrc:   PainSc: Asleep      Patients Stated Pain Goal: 0 (01/29/22 1227)  Complications: No notable events documented.

## 2022-01-29 NOTE — Plan of Care (Signed)
  Problem: Education: Goal: Knowledge of General Education information will improve Description: Including pain rating scale, medication(s)/side effects and non-pharmacologic comfort measures Outcome: Not Progressing   Problem: Health Behavior/Discharge Planning: Goal: Ability to manage health-related needs will improve Outcome: Not Progressing   Problem: Clinical Measurements: Goal: Will remain free from infection Outcome: Not Progressing   Problem: Nutrition: Goal: Adequate nutrition will be maintained Outcome: Not Progressing   Problem: Coping: Goal: Level of anxiety will decrease Outcome: Not Progressing   Problem: Safety: Goal: Ability to remain free from injury will improve Outcome: Not Progressing   Problem: Skin Integrity: Goal: Risk for impaired skin integrity will decrease Outcome: Not Progressing

## 2022-01-29 NOTE — Progress Notes (Signed)
Security escorting patient, patient sign AMA form, IV discontinue.   MD notified.

## 2022-01-29 NOTE — Consult Note (Signed)
ORTHOPAEDIC CONSULTATION  REQUESTING PHYSICIAN: Wouk, Wilfred Curtis, MD  Chief Complaint: Left forearm abscess  HPI: David Bean is a 46 y.o. male with a history of IV drug use who complains of left forearm abscess.  Patient states he injected drugs approximately 6 days ago into the left forearm.  Patient has CT scan in the ER demonstrating intramuscular abscess in the proximal left forearm.  Past Medical History:  Diagnosis Date   Acute kidney injury (HCC) 01/14/2020   Facial bones, closed fracture (HCC) 09/20/2019   broken nose   Polysubstance abuse (HCC)    History reviewed. No pertinent surgical history. Social History   Socioeconomic History   Marital status: Divorced    Spouse name: Not on file   Number of children: Not on file   Years of education: Not on file   Highest education level: Not on file  Occupational History   Not on file  Tobacco Use   Smoking status: Every Day    Packs/day: 2.00    Types: Cigarettes   Smokeless tobacco: Never  Substance and Sexual Activity   Alcohol use: Yes   Drug use: Yes   Sexual activity: Not on file  Other Topics Concern   Not on file  Social History Narrative   Not on file   Social Determinants of Health   Financial Resource Strain: Not on file  Food Insecurity: Not on file  Transportation Needs: Not on file  Physical Activity: Not on file  Stress: Not on file  Social Connections: Not on file   No family history on file. Allergies  Allergen Reactions   Aspirin Anaphylaxis   Prior to Admission medications   Medication Sig Start Date End Date Taking? Authorizing Provider  hydrocortisone cream (PREPARATION H) 1 % Apply 1 application topically 2 (two) times daily. Patient not taking: Reported on 01/28/2022 12/20/19   Jene Every, MD   CT HUMERUS LEFT W CONTRAST  Result Date: 01/28/2022 CLINICAL DATA:  Left lower extremity pain and swelling. EXAM: CT OF THE UPPER LEFT HUMERUS WITH CONTRAST CT OF THE UPPER LEFT  FOREARM WITH CONTRAST CT OF THE UPPER LEFT HAND WITH CONTRAST TECHNIQUE: Multidetector CT imaging of the left humerus was performed according to the standard protocol following intravenous contrast administration. Multidetector CT imaging of the left forearm was performed according to the standard protocol following intravenous contrast administration. Multidetector CT imaging of the left hand was performed according to the standard protocol following intravenous contrast administration. RADIATION DOSE REDUCTION: This exam was performed according to the departmental dose-optimization program which includes automated exposure control, adjustment of the mA and/or kV according to patient size and/or use of iterative reconstruction technique. CONTRAST:  38mL OMNIPAQUE IOHEXOL 300 MG/ML  SOLN COMPARISON:  None Available. FINDINGS: Bones/Joint/Cartilage No fracture or dislocation. Normal alignment. No joint effusion. Ligaments Ligaments are suboptimally evaluated by CT. Muscles and Tendons 6.7 x 3.8 x 8.5 cm peripherally enhancing complex fluid collection in the flexor carpi radialis and flexor digitorum superficialis muscles most consistent with an intramuscular abscess. Remainder the muscles demonstrate no focal abnormality. Soft tissue No fluid collection or hematoma. No soft tissue mass. Soft tissue edema in the subcutaneous fat involving the volar aspect of the upper arm, circumferentially involving the forearm and dorsal aspect of the hand most concerning for cellulitis. IMPRESSION: 1. Pyomyositis involving the flexor carpi radialis and flexor digitorum superficialis muscles with a 6.7 x 3.8 x 8.5 cm intramuscular abscess. 2. Soft tissue edema in the subcutaneous fat involving  the volar aspect of the upper arm, circumferentially involving the forearm and dorsal aspect of the hand most concerning for cellulitis. Electronically Signed   By: Elige Ko M.D.   On: 01/28/2022 18:36   CT FOREARM LEFT W  CONTRAST  Result Date: 01/28/2022 CLINICAL DATA:  Left lower extremity pain and swelling. EXAM: CT OF THE UPPER LEFT HUMERUS WITH CONTRAST CT OF THE UPPER LEFT FOREARM WITH CONTRAST CT OF THE UPPER LEFT HAND WITH CONTRAST TECHNIQUE: Multidetector CT imaging of the left humerus was performed according to the standard protocol following intravenous contrast administration. Multidetector CT imaging of the left forearm was performed according to the standard protocol following intravenous contrast administration. Multidetector CT imaging of the left hand was performed according to the standard protocol following intravenous contrast administration. RADIATION DOSE REDUCTION: This exam was performed according to the departmental dose-optimization program which includes automated exposure control, adjustment of the mA and/or kV according to patient size and/or use of iterative reconstruction technique. CONTRAST:  53mL OMNIPAQUE IOHEXOL 300 MG/ML  SOLN COMPARISON:  None Available. FINDINGS: Bones/Joint/Cartilage No fracture or dislocation. Normal alignment. No joint effusion. Ligaments Ligaments are suboptimally evaluated by CT. Muscles and Tendons 6.7 x 3.8 x 8.5 cm peripherally enhancing complex fluid collection in the flexor carpi radialis and flexor digitorum superficialis muscles most consistent with an intramuscular abscess. Remainder the muscles demonstrate no focal abnormality. Soft tissue No fluid collection or hematoma. No soft tissue mass. Soft tissue edema in the subcutaneous fat involving the volar aspect of the upper arm, circumferentially involving the forearm and dorsal aspect of the hand most concerning for cellulitis. IMPRESSION: 1. Pyomyositis involving the flexor carpi radialis and flexor digitorum superficialis muscles with a 6.7 x 3.8 x 8.5 cm intramuscular abscess. 2. Soft tissue edema in the subcutaneous fat involving the volar aspect of the upper arm, circumferentially involving the forearm and  dorsal aspect of the hand most concerning for cellulitis. Electronically Signed   By: Elige Ko M.D.   On: 01/28/2022 18:36   CT HAND LEFT W CONTRAST  Result Date: 01/28/2022 CLINICAL DATA:  Left lower extremity pain and swelling. EXAM: CT OF THE UPPER LEFT HUMERUS WITH CONTRAST CT OF THE UPPER LEFT FOREARM WITH CONTRAST CT OF THE UPPER LEFT HAND WITH CONTRAST TECHNIQUE: Multidetector CT imaging of the left humerus was performed according to the standard protocol following intravenous contrast administration. Multidetector CT imaging of the left forearm was performed according to the standard protocol following intravenous contrast administration. Multidetector CT imaging of the left hand was performed according to the standard protocol following intravenous contrast administration. RADIATION DOSE REDUCTION: This exam was performed according to the departmental dose-optimization program which includes automated exposure control, adjustment of the mA and/or kV according to patient size and/or use of iterative reconstruction technique. CONTRAST:  56mL OMNIPAQUE IOHEXOL 300 MG/ML  SOLN COMPARISON:  None Available. FINDINGS: Bones/Joint/Cartilage No fracture or dislocation. Normal alignment. No joint effusion. Ligaments Ligaments are suboptimally evaluated by CT. Muscles and Tendons 6.7 x 3.8 x 8.5 cm peripherally enhancing complex fluid collection in the flexor carpi radialis and flexor digitorum superficialis muscles most consistent with an intramuscular abscess. Remainder the muscles demonstrate no focal abnormality. Soft tissue No fluid collection or hematoma. No soft tissue mass. Soft tissue edema in the subcutaneous fat involving the volar aspect of the upper arm, circumferentially involving the forearm and dorsal aspect of the hand most concerning for cellulitis. IMPRESSION: 1. Pyomyositis involving the flexor carpi radialis and flexor digitorum superficialis  muscles with a 6.7 x 3.8 x 8.5 cm intramuscular  abscess. 2. Soft tissue edema in the subcutaneous fat involving the volar aspect of the upper arm, circumferentially involving the forearm and dorsal aspect of the hand most concerning for cellulitis. Electronically Signed   By: Kathreen Devoid M.D.   On: 01/28/2022 18:36   US Venous Img Upper Uni Left  Result Date: 01/28/2022 CLINICAL DATA:  Intermittent chest pain, left arm edema EXAM: LEFT UPPER EXTREMITY VENOUS DOPPLER ULTRASOUND TECHNIQUE: Gray-scale sonography with graded compression, as well as color Doppler and duplex ultrasound were performed to evaluate the upper extremity deep venous system from the level of the subclavian vein and including the jugular, axillary, basilic, radial, ulnar and upper cephalic vein. Spectral Doppler was utilized to evaluate flow at rest and with distal augmentation maneuvers. COMPARISON:  None Available. FINDINGS: Contralateral Subclavian Vein: Respiratory phasicity is normal and symmetric with the symptomatic side. No evidence of thrombus. Normal compressibility. Internal Jugular Vein: No evidence of thrombus. Normal compressibility, respiratory phasicity and response to augmentation. Subclavian Vein: No evidence of thrombus. Normal compressibility, respiratory phasicity and response to augmentation. Axillary Vein: No evidence of thrombus. Normal compressibility, respiratory phasicity and response to augmentation. Cephalic Vein: Not visualized. Basilic Vein: No evidence of thrombus. Normal compressibility, respiratory phasicity and response to augmentation. Brachial Veins: No evidence of thrombus. Normal compressibility, respiratory phasicity and response to augmentation. Radial Veins: No evidence of thrombus. Normal compressibility, respiratory phasicity and response to augmentation. Ulnar Veins: No evidence of thrombus. Normal compressibility, respiratory phasicity and response to augmentation. Venous Reflux:  None visualized. Other Findings:  None visualized. IMPRESSION:  The left cephalic vein could not be visualized. No evidence of DVT within the remainder of the left upper extremity. Electronically Signed   By: Merilyn Baba M.D.   On: 01/28/2022 17:47   DG Chest 2 View  Result Date: 01/28/2022 CLINICAL DATA:  Left arm edema, intermittent chest pain, fever and chills EXAM: CHEST - 2 VIEW COMPARISON:  07/16/2016 FINDINGS: Frontal and lateral views of the chest demonstrate an unremarkable cardiac silhouette. No acute airspace disease, effusion, or pneumothorax. There are no acute bony abnormalities. IMPRESSION: 1. No acute intrathoracic process. Electronically Signed   By: Randa Ngo M.D.   On: 01/28/2022 16:58    Positive ROS: All other systems have been reviewed and were otherwise negative with the exception of those mentioned in the HPI and as above.  Physical Exam: General: Alert, no acute distress.  Patient lying in bed.  He will not open his eyes during my examination.  MUSCULOSKELETAL: Left forearm: Patient with large amount of volar swelling of the proximal forearm with surrounding erythema.  This area is tender to palpation.  Distally he is neurovascular intact.  The compartments of his left forearm and arm are soft and compressible.  Assessment: Left forearm abscess  Plan: I explained to the patient that he will require surgery for an I&D of the left forearm abscess.  Patient will continue IV antibiotics as ordered.  Cultures will be sent from the OR.  Patient will remain n.p.o. until after surgery.  I discussed the risks and benefits of surgery. The risks include but are not limited to persistent infection, bleeding, nerve or blood vessel injury, joint stiffness or loss of motion, persistent pain, weakness or instability,  and the need for further surgery.     Thornton Park, MD    01/29/2022 9:24 AM

## 2022-01-29 NOTE — Anesthesia Postprocedure Evaluation (Signed)
Anesthesia Post Note  Patient: David Bean  Procedure(s) Performed: INCISION AND DRAINAGE ABSCESS (Left: Arm Upper)  Patient location during evaluation: PACU Anesthesia Type: General Level of consciousness: awake and alert, oriented and patient cooperative Pain management: pain level controlled Vital Signs Assessment: post-procedure vital signs reviewed and stable Respiratory status: spontaneous breathing, nonlabored ventilation and respiratory function stable Cardiovascular status: blood pressure returned to baseline and stable Postop Assessment: adequate PO intake Anesthetic complications: no   No notable events documented.   Last Vitals:  Vitals:   01/29/22 1450 01/29/22 1500  BP:  104/75  Pulse: 74 73  Resp: 16 13  Temp:    SpO2: 97% 95%    Last Pain:  Vitals:   01/29/22 1500  TempSrc:   PainSc: Asleep                 Reed Breech

## 2022-01-29 NOTE — Progress Notes (Signed)
Found a non traditional lighter in his belongings. Explain that we had to lock it in security. Pt start scalding and became very agitated, stated "he wasn't staying."

## 2022-01-29 NOTE — Progress Notes (Signed)
Patient refused to change into gown for surgery.

## 2022-01-29 NOTE — Anesthesia Procedure Notes (Signed)
Procedure Name: Intubation Date/Time: 01/29/2022 1:09 PM  Performed by: Elmarie Mainland, CRNAPre-anesthesia Checklist: Patient identified, Emergency Drugs available, Suction available and Patient being monitored Patient Re-evaluated:Patient Re-evaluated prior to induction Oxygen Delivery Method: Circle system utilized Preoxygenation: Pre-oxygenation with 100% oxygen Induction Type: IV induction and Rapid sequence Laryngoscope Size: McGraph and 3 Grade View: Grade I Tube type: Oral Tube size: 7.0 mm Number of attempts: 1 Airway Equipment and Method: Stylet and Video-laryngoscopy Placement Confirmation: ETT inserted through vocal cords under direct vision, positive ETCO2 and breath sounds checked- equal and bilateral Secured at: 22 cm Tube secured with: Tape Dental Injury: Teeth and Oropharynx as per pre-operative assessment

## 2022-01-29 NOTE — Consult Note (Signed)
Pharmacy Antibiotic Note  David Bean is a 46 y.o. male admitted on 01/28/2022 with cellulitis/abscess.  Pharmacy has been consulted for cefepime and vancomycin dosing.  Plan: Cefepime 2g IV every 8 hours  -Vancomycin loading dose of 2000mg  given in ED Will continue with Vancomycin 1500 mg IV Q 12 hrs. Goal AUC 400-550. Expected AUC: 517 SCr used: 0.79 Cmin 12.9  F/u renal fxn, cultures   Height: 5\' 9"  (175.3 cm) Weight: 79.4 kg (175 lb) IBW/kg (Calculated) : 70.7  Temp (24hrs), Avg:99.3 F (37.4 C), Min:98.8 F (37.1 C), Max:99.8 F (37.7 C)  Recent Labs  Lab 01/28/22 1610 01/28/22 1621 01/28/22 1752  WBC 11.7*  --   --   CREATININE 0.79  --   --   LATICACIDVEN  --  2.7* 1.6     Estimated Creatinine Clearance: 115.4 mL/min (by C-G formula based on SCr of 0.79 mg/dL).    Allergies  Allergen Reactions   Aspirin Anaphylaxis    Antimicrobials this admission: 7/20 Cefepime >>  7/20 Vancomycin >>    Microbiology results: 7/20 BCx: sent 7/20 MRSA PCR: positive  Thank you for allowing pharmacy to be a part of this patient's care.  Rayhaan Huster A 01/29/2022 2:54 AM

## 2022-01-29 NOTE — Progress Notes (Signed)
Patient getting out of bed, bed alarm going off, patient refused bed alarm. Explained to patient about fall risks after surgery and sedation, patient states, "I dont need no one to tell me if I can take a piss." Bed alarm off, Alissy RN made aware. Patient currently in bathroom.

## 2022-01-29 NOTE — Anesthesia Preprocedure Evaluation (Addendum)
Anesthesia Evaluation  Patient identified by MRN, date of birth, ID band Patient awake    Reviewed: Allergy & Precautions, NPO status , Patient's Chart, lab work & pertinent test results  History of Anesthesia Complications Negative for: history of anesthetic complications  Airway Mallampati: IV   Neck ROM: Full    Dental  (+) Chipped   Pulmonary former smoker (quit smoking months ago; current vaping),    Pulmonary exam normal breath sounds clear to auscultation       Cardiovascular Normal cardiovascular exam Rhythm:Regular Rate:Normal  ECG 01/28/22:  Sinus rhythm Ventricular premature complex Borderline prolonged QT interval   Neuro/Psych Polysubstance use disorder; pt states he used ice 4-5 days ago    GI/Hepatic negative GI ROS,   Endo/Other  negative endocrine ROS  Renal/GU negative Renal ROS     Musculoskeletal   Abdominal   Peds  Hematology  (+) REFUSES BLOOD PRODUCTS,   Anesthesia Other Findings Pt ate sandwich at 3:30 am on 01/29/22, and was drinking water just prior to surgery.  Reproductive/Obstetrics                            Anesthesia Physical Anesthesia Plan  ASA: 3 and emergent  Anesthesia Plan: General   Post-op Pain Management:    Induction: Intravenous and Rapid sequence  PONV Risk Score and Plan: 1 and Ondansetron, Dexamethasone and Treatment may vary due to age or medical condition  Airway Management Planned: Oral ETT  Additional Equipment:   Intra-op Plan:   Post-operative Plan: Extubation in OR  Informed Consent: I have reviewed the patients History and Physical, chart, labs and discussed the procedure including the risks, benefits and alternatives for the proposed anesthesia with the patient or authorized representative who has indicated his/her understanding and acceptance.     Dental advisory given  Plan Discussed with: CRNA  Anesthesia Plan  Comments: (Patient consented for risks of anesthesia including but not limited to:  - adverse reactions to medications - damage to eyes, teeth, lips or other oral mucosa - nerve damage due to positioning  - sore throat or hoarseness - damage to heart, brain, nerves, lungs, other parts of body or loss of life  Informed patient about role of CRNA in peri- and intra-operative care.  Patient voiced understanding.)       Anesthesia Quick Evaluation

## 2022-01-29 NOTE — Progress Notes (Signed)
Pt stated he drank water out of the bathroom faucet. I explained the upcoming procedure and why he is NPO. PT stated " I don't give a damn. I have to drink something" I notified the oncoming nurse and the MD. Pt is also refusing TELE
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
## 2022-01-30 ENCOUNTER — Encounter: Payer: Self-pay | Admitting: Orthopedic Surgery

## 2022-01-30 LAB — RPR: RPR Ser Ql: NONREACTIVE

## 2022-01-31 LAB — QUANTIFERON-TB GOLD PLUS (RQFGPL)
QuantiFERON Mitogen Value: >10 IU/mL
QuantiFERON Nil Value: 0.04 IU/mL
QuantiFERON TB1 Ag Value: 0.02 IU/mL
QuantiFERON TB2 Ag Value: 0.04 IU/mL

## 2022-01-31 LAB — QUANTIFERON-TB GOLD PLUS: QuantiFERON-TB Gold Plus: NEGATIVE

## 2022-02-02 LAB — CULTURE, BLOOD (ROUTINE X 2)
Culture: NO GROWTH
Culture: NO GROWTH
Special Requests: ADEQUATE

## 2022-02-02 NOTE — Progress Notes (Addendum)
[name] is a 46 year old IV drug user, who had injected his left forearm approximately 5-6 days prior to presenting to the [facility] ER on 01/28/22.  He met sepsis criteria.  A CT scan of the left upper extremity demonstrated pyomyositis of the FCR and FDS muscles with a large abscess. Patient was admitted to the hospitalist service and started on IV vancomycin and cefipime. I evaluated the patient on the morning of 01/29/22 and recommended an I&D of his left forearm abscess and explained to him that if this infection was not treated appropriately he risked losing his left upper extremity and possible death.  Patient underwent an I&D of the left proximal forearm abscess later on 01/29/22 and a large abscess was evacuated and his forearm thoroughly irrigated with antibiotic infused saline. Patient left AMA the night of surgery.  I have continued to follow his cultures from surgery.    Results are below:  [place] WBC PRESENT,BOTH PMN AND MONONUCLEAR  FEW GRAM POSITIVE COCCI IN PAIRS AND CHAINS  Performed at [facility], [place] [address], [place], [place] [address]   Culture MODERATE STREPTOCOCCUS INTERMEDIUS  SUSCEPTIBILITIES TO FOLLOW  FEW HAEMOPHILUS PARAINFLUENZAE  BETA LACTAMASE NEGATIVE  NO ANAEROBES ISOLATED; CULTURE IN PROGRESS FOR 5 DAYS     I have discussed these results with Dr. Delaine Lame, who was not consulted on this patient during his hospitalization.  She stated his Strep. Intermedius can be treated with Amoxicillin 1000 mg PO TID or augmentin 875 mg PO BID.   Medications would be provided to the patient free of charge at the [facility] outpatient pharmacy as part of the medication management clinic.    Patient left no personal phone number to contact him when he [place].  It is thought he might be homeless.  The only phone number available in his Epic chart is [employer] where the patient is presumably employed.   I have called the number to the [facility] provided and got a voicemail.  I left a message to ask that Mr. Bray call me at [facility] so I can tell him how to pick up appropriate antibiotics at [facility] outpatient pharmacy free of charge and to arrange a followup to remove the sutures in his left forearm.

## 2022-02-06 LAB — AEROBIC/ANAEROBIC CULTURE W GRAM STAIN (SURGICAL/DEEP WOUND)

## 2022-07-02 ENCOUNTER — Emergency Department: Payer: Self-pay

## 2022-07-02 ENCOUNTER — Inpatient Hospital Stay
Admission: EM | Admit: 2022-07-02 | Discharge: 2022-08-06 | DRG: 551 | Discharge status: Against Medical Advice | Payer: Self-pay | Attending: Internal Medicine | Admitting: Internal Medicine

## 2022-07-02 ENCOUNTER — Other Ambulatory Visit: Payer: Self-pay

## 2022-07-02 ENCOUNTER — Observation Stay: Payer: Self-pay

## 2022-07-02 DIAGNOSIS — Z5181 Encounter for therapeutic drug level monitoring: Secondary | ICD-10-CM

## 2022-07-02 DIAGNOSIS — A4102 Sepsis due to Methicillin resistant Staphylococcus aureus: Secondary | ICD-10-CM | POA: Diagnosis present

## 2022-07-02 DIAGNOSIS — Z7401 Bed confinement status: Secondary | ICD-10-CM

## 2022-07-02 DIAGNOSIS — F17210 Nicotine dependence, cigarettes, uncomplicated: Secondary | ICD-10-CM | POA: Diagnosis present

## 2022-07-02 DIAGNOSIS — M4623 Osteomyelitis of vertebra, cervicothoracic region: Secondary | ICD-10-CM | POA: Diagnosis present

## 2022-07-02 DIAGNOSIS — M4646 Discitis, unspecified, lumbar region: Secondary | ICD-10-CM | POA: Diagnosis present

## 2022-07-02 DIAGNOSIS — M4643 Discitis, unspecified, cervicothoracic region: Secondary | ICD-10-CM | POA: Diagnosis present

## 2022-07-02 DIAGNOSIS — B9562 Methicillin resistant Staphylococcus aureus infection as the cause of diseases classified elsewhere: Secondary | ICD-10-CM

## 2022-07-02 DIAGNOSIS — D696 Thrombocytopenia, unspecified: Secondary | ICD-10-CM | POA: Diagnosis not present

## 2022-07-02 DIAGNOSIS — M4626 Osteomyelitis of vertebra, lumbar region: Secondary | ICD-10-CM | POA: Diagnosis present

## 2022-07-02 DIAGNOSIS — F32A Depression, unspecified: Secondary | ICD-10-CM | POA: Diagnosis present

## 2022-07-02 DIAGNOSIS — Z5900 Homelessness unspecified: Secondary | ICD-10-CM

## 2022-07-02 DIAGNOSIS — Z87892 Personal history of anaphylaxis: Secondary | ICD-10-CM

## 2022-07-02 DIAGNOSIS — E278 Other specified disorders of adrenal gland: Secondary | ICD-10-CM | POA: Diagnosis present

## 2022-07-02 DIAGNOSIS — S32029A Unspecified fracture of second lumbar vertebra, initial encounter for closed fracture: Principal | ICD-10-CM | POA: Diagnosis present

## 2022-07-02 DIAGNOSIS — E871 Hypo-osmolality and hyponatremia: Secondary | ICD-10-CM | POA: Diagnosis not present

## 2022-07-02 DIAGNOSIS — G5621 Lesion of ulnar nerve, right upper limb: Secondary | ICD-10-CM | POA: Diagnosis present

## 2022-07-02 DIAGNOSIS — M549 Dorsalgia, unspecified: Secondary | ICD-10-CM | POA: Diagnosis present

## 2022-07-02 DIAGNOSIS — Z1152 Encounter for screening for COVID-19: Secondary | ICD-10-CM

## 2022-07-02 DIAGNOSIS — E279 Disorder of adrenal gland, unspecified: Secondary | ICD-10-CM | POA: Diagnosis present

## 2022-07-02 DIAGNOSIS — N433 Hydrocele, unspecified: Secondary | ICD-10-CM | POA: Diagnosis present

## 2022-07-02 DIAGNOSIS — R7881 Bacteremia: Secondary | ICD-10-CM

## 2022-07-02 DIAGNOSIS — B1910 Unspecified viral hepatitis B without hepatic coma: Secondary | ICD-10-CM | POA: Diagnosis present

## 2022-07-02 DIAGNOSIS — F1510 Other stimulant abuse, uncomplicated: Secondary | ICD-10-CM | POA: Diagnosis present

## 2022-07-02 DIAGNOSIS — I38 Endocarditis, valve unspecified: Secondary | ICD-10-CM

## 2022-07-02 DIAGNOSIS — S32020A Wedge compression fracture of second lumbar vertebra, initial encounter for closed fracture: Secondary | ICD-10-CM

## 2022-07-02 DIAGNOSIS — M62830 Muscle spasm of back: Secondary | ICD-10-CM | POA: Diagnosis present

## 2022-07-02 DIAGNOSIS — R051 Acute cough: Secondary | ICD-10-CM | POA: Diagnosis not present

## 2022-07-02 DIAGNOSIS — Z5901 Sheltered homelessness: Secondary | ICD-10-CM

## 2022-07-02 DIAGNOSIS — F322 Major depressive disorder, single episode, severe without psychotic features: Secondary | ICD-10-CM | POA: Insufficient documentation

## 2022-07-02 DIAGNOSIS — B963 Hemophilus influenzae [H. influenzae] as the cause of diseases classified elsewhere: Secondary | ICD-10-CM | POA: Diagnosis not present

## 2022-07-02 DIAGNOSIS — R600 Localized edema: Secondary | ICD-10-CM | POA: Diagnosis not present

## 2022-07-02 DIAGNOSIS — K625 Hemorrhage of anus and rectum: Secondary | ICD-10-CM | POA: Diagnosis present

## 2022-07-02 DIAGNOSIS — D75838 Other thrombocytosis: Secondary | ICD-10-CM | POA: Diagnosis not present

## 2022-07-02 DIAGNOSIS — I861 Scrotal varices: Secondary | ICD-10-CM | POA: Diagnosis present

## 2022-07-02 DIAGNOSIS — Z886 Allergy status to analgesic agent status: Secondary | ICD-10-CM

## 2022-07-02 DIAGNOSIS — A4101 Sepsis due to Methicillin susceptible Staphylococcus aureus: Secondary | ICD-10-CM | POA: Insufficient documentation

## 2022-07-02 DIAGNOSIS — R45851 Suicidal ideations: Secondary | ICD-10-CM | POA: Diagnosis present

## 2022-07-02 DIAGNOSIS — R200 Anesthesia of skin: Secondary | ICD-10-CM | POA: Diagnosis present

## 2022-07-02 DIAGNOSIS — R7989 Other specified abnormal findings of blood chemistry: Secondary | ICD-10-CM | POA: Diagnosis present

## 2022-07-02 DIAGNOSIS — D75839 Thrombocytosis, unspecified: Secondary | ICD-10-CM | POA: Insufficient documentation

## 2022-07-02 DIAGNOSIS — M4856XA Collapsed vertebra, not elsewhere classified, lumbar region, initial encounter for fracture: Secondary | ICD-10-CM | POA: Diagnosis present

## 2022-07-02 LAB — CBC WITH DIFFERENTIAL/PLATELET
Abs Immature Granulocytes: 0.03 10*3/uL (ref 0.00–0.07)
Basophils Absolute: 0 10*3/uL (ref 0.0–0.1)
Basophils Relative: 0 %
Eosinophils Absolute: 0 10*3/uL (ref 0.0–0.5)
Eosinophils Relative: 1 %
HCT: 35.4 % — ABNORMAL LOW (ref 39.0–52.0)
Hemoglobin: 11.9 g/dL — ABNORMAL LOW (ref 13.0–17.0)
Immature Granulocytes: 0 %
Lymphocytes Relative: 14 %
Lymphs Abs: 1.1 10*3/uL (ref 0.7–4.0)
MCH: 29.8 pg (ref 26.0–34.0)
MCHC: 33.6 g/dL (ref 30.0–36.0)
MCV: 88.5 fL (ref 80.0–100.0)
Monocytes Absolute: 0.4 10*3/uL (ref 0.1–1.0)
Monocytes Relative: 5 %
Neutro Abs: 5.9 10*3/uL (ref 1.7–7.7)
Neutrophils Relative %: 80 %
Platelets: 315 10*3/uL (ref 150–400)
RBC: 4 MIL/uL — ABNORMAL LOW (ref 4.22–5.81)
RDW: 13.3 % (ref 11.5–15.5)
WBC: 7.5 10*3/uL (ref 4.0–10.5)
nRBC: 0 % (ref 0.0–0.2)

## 2022-07-02 LAB — CK: Total CK: 45 U/L — ABNORMAL LOW (ref 49–397)

## 2022-07-02 LAB — COMPREHENSIVE METABOLIC PANEL
ALT: 52 U/L — ABNORMAL HIGH (ref 0–44)
AST: 54 U/L — ABNORMAL HIGH (ref 15–41)
Albumin: 2.9 g/dL — ABNORMAL LOW (ref 3.5–5.0)
Alkaline Phosphatase: 121 U/L (ref 38–126)
Anion gap: 11 (ref 5–15)
BUN: 18 mg/dL (ref 6–20)
CO2: 23 mmol/L (ref 22–32)
Calcium: 8.7 mg/dL — ABNORMAL LOW (ref 8.9–10.3)
Chloride: 103 mmol/L (ref 98–111)
Creatinine, Ser: 0.6 mg/dL — ABNORMAL LOW (ref 0.61–1.24)
GFR, Estimated: >60 mL/min (ref >60)
Glucose, Bld: 172 mg/dL — ABNORMAL HIGH (ref 70–99)
Potassium: 4.2 mmol/L (ref 3.5–5.1)
Sodium: 137 mmol/L (ref 135–145)
Total Bilirubin: 0.7 mg/dL (ref 0.3–1.2)
Total Protein: 7.4 g/dL (ref 6.5–8.1)

## 2022-07-02 MED ORDER — MORPHINE SULFATE (PF) 4 MG/ML IV SOLN
4.0000 mg | Freq: Once | INTRAVENOUS | Status: AC
  Administered 2022-07-02: 4 mg via INTRAVENOUS
  Filled 2022-07-02: qty 1

## 2022-07-02 MED ORDER — IOHEXOL 300 MG/ML  SOLN
100.0000 mL | Freq: Once | INTRAMUSCULAR | Status: AC | PRN
  Administered 2022-07-02: 100 mL via INTRAVENOUS

## 2022-07-02 MED ORDER — OXYCODONE HCL 5 MG PO TABS
5.0000 mg | ORAL_TABLET | ORAL | Status: DC | PRN
  Administered 2022-07-15 – 2022-07-16 (×4): 5 mg via ORAL
  Filled 2022-07-02 (×5): qty 1

## 2022-07-02 MED ORDER — ACETAMINOPHEN 325 MG PO TABS
650.0000 mg | ORAL_TABLET | ORAL | Status: AC
  Administered 2022-07-02 – 2022-07-04 (×8): 650 mg via ORAL
  Filled 2022-07-02 (×8): qty 2

## 2022-07-02 MED ORDER — LACTATED RINGERS IV BOLUS
1000.0000 mL | Freq: Once | INTRAVENOUS | Status: AC
  Administered 2022-07-02: 1000 mL via INTRAVENOUS

## 2022-07-02 MED ORDER — DOCUSATE SODIUM 50 MG/5ML PO LIQD: 100.0000 mg | Freq: Two times a day (BID) | ORAL | Status: DC | PRN

## 2022-07-02 MED ORDER — BACLOFEN 10 MG PO TABS
10.0000 mg | ORAL_TABLET | Freq: Three times a day (TID) | ORAL | Status: AC
  Administered 2022-07-02 – 2022-07-04 (×6): 10 mg via ORAL
  Filled 2022-07-02 (×6): qty 1

## 2022-07-02 MED ORDER — OXYCODONE HCL 5 MG PO TABS
10.0000 mg | ORAL_TABLET | ORAL | Status: DC | PRN
  Administered 2022-07-02 – 2022-07-15 (×42): 10 mg via ORAL
  Filled 2022-07-02 (×43): qty 2

## 2022-07-02 MED ORDER — HYDROMORPHONE HCL 1 MG/ML IJ SOLN
0.5000 mg | INTRAMUSCULAR | Status: DC | PRN
  Administered 2022-07-02 – 2022-07-06 (×12): 0.5 mg via INTRAVENOUS
  Filled 2022-07-02: qty 1
  Filled 2022-07-02: qty 0.5
  Filled 2022-07-02 (×2): qty 1
  Filled 2022-07-02 (×2): qty 0.5
  Filled 2022-07-02 (×3): qty 1
  Filled 2022-07-02: qty 0.5
  Filled 2022-07-02 (×2): qty 1

## 2022-07-02 MED ORDER — POLYETHYLENE GLYCOL 3350 17 G PO PACK
17.0000 g | PACK | Freq: Every day | ORAL | Status: DC
  Administered 2022-07-03 – 2022-07-26 (×9): 17 g via ORAL
  Filled 2022-07-02 (×23): qty 1

## 2022-07-02 NOTE — ED Notes (Addendum)
Given pt cup of water per [facility]. Pt also stating "so I'm not gonna get to eat tonight?" Told pt after MRI scan has resulted we can try to get something to eat.
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
## 2022-07-02 NOTE — Assessment & Plan Note (Signed)
New. Check hepatitis panel

## 2022-07-02 NOTE — ED Notes (Signed)
Upon MRI returning to attempt to do patient's scan. Patient refused to do scan at this time and states "I want to try to do it tomorrow."

## 2022-07-02 NOTE — Assessment & Plan Note (Signed)
Need to figure out acuity.  Agree with getting MRI

## 2022-07-02 NOTE — ED Notes (Signed)
Patient moaning in pain. Patient placed on hospital bed at this time and provided with ordered prn and scheduled medication. MRI attempted to transport patient for scan but states they will come back in just a little to give medication time to work.

## 2022-07-02 NOTE — ED Notes (Signed)
Verbal orders for scans from [facility] MD

## 2022-07-02 NOTE — Assessment & Plan Note (Signed)
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 Recommend follow-up adrenal washout CT in 1 year. If stable for = 1 year, no further follow-up imaging.
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
## 2022-07-02 NOTE — Assessment & Plan Note (Signed)
Order baclofen

## 2022-07-02 NOTE — Assessment & Plan Note (Signed)
Due to above.  Will order multimodal pain regimen

## 2022-07-03 ENCOUNTER — Observation Stay: Payer: Self-pay

## 2022-07-03 DIAGNOSIS — M62830 Muscle spasm of back: Secondary | ICD-10-CM

## 2022-07-03 DIAGNOSIS — E871 Hypo-osmolality and hyponatremia: Secondary | ICD-10-CM | POA: Insufficient documentation

## 2022-07-03 DIAGNOSIS — M545 Low back pain, unspecified: Secondary | ICD-10-CM

## 2022-07-03 DIAGNOSIS — G5621 Lesion of ulnar nerve, right upper limb: Secondary | ICD-10-CM

## 2022-07-03 DIAGNOSIS — S32020A Wedge compression fracture of second lumbar vertebra, initial encounter for closed fracture: Secondary | ICD-10-CM

## 2022-07-03 LAB — HEPATIC FUNCTION PANEL
ALT: 42 U/L (ref 0–44)
AST: 45 U/L — ABNORMAL HIGH (ref 15–41)
Albumin: 2.9 g/dL — ABNORMAL LOW (ref 3.5–5.0)
Alkaline Phosphatase: 129 U/L — ABNORMAL HIGH (ref 38–126)
Bilirubin, Direct: 0.2 mg/dL (ref 0.0–0.2)
Indirect Bilirubin: 0.6 mg/dL (ref 0.3–0.9)
Total Bilirubin: 0.8 mg/dL (ref 0.3–1.2)
Total Protein: 7.3 g/dL (ref 6.5–8.1)

## 2022-07-03 LAB — BASIC METABOLIC PANEL
Anion gap: 10 (ref 5–15)
BUN: 14 mg/dL (ref 6–20)
CO2: 25 mmol/L (ref 22–32)
Calcium: 8.4 mg/dL — ABNORMAL LOW (ref 8.9–10.3)
Chloride: 99 mmol/L (ref 98–111)
Creatinine, Ser: 0.69 mg/dL (ref 0.61–1.24)
GFR, Estimated: >60 mL/min (ref >60)
Glucose, Bld: 105 mg/dL — ABNORMAL HIGH (ref 70–99)
Potassium: 4.7 mmol/L (ref 3.5–5.1)
Sodium: 134 mmol/L — ABNORMAL LOW (ref 135–145)

## 2022-07-03 LAB — URINE DRUG SCREEN, QUALITATIVE (ARMC ONLY)
Amphetamines, Ur Screen: POSITIVE — AB
Barbiturates, Ur Screen: NOT DETECTED
Benzodiazepine, Ur Scrn: NOT DETECTED
Cannabinoid 50 Ng, Ur ~~LOC~~: NOT DETECTED
Cocaine Metabolite,Ur ~~LOC~~: NOT DETECTED
MDMA (Ecstasy)Ur Screen: NOT DETECTED
Methadone Scn, Ur: NOT DETECTED
Opiate, Ur Screen: POSITIVE — AB
Phencyclidine (PCP) Ur S: NOT DETECTED
Tricyclic, Ur Screen: NOT DETECTED

## 2022-07-03 LAB — PROCALCITONIN: Procalcitonin: 0.52 ng/mL

## 2022-07-03 LAB — URINALYSIS, ROUTINE W REFLEX MICROSCOPIC
Bacteria, UA: NONE SEEN
Bilirubin Urine: NEGATIVE
Glucose, UA: NEGATIVE mg/dL
Hgb urine dipstick: NEGATIVE
Ketones, ur: NEGATIVE mg/dL
Leukocytes,Ua: NEGATIVE
Nitrite: NEGATIVE
Protein, ur: 30 mg/dL — AB
Specific Gravity, Urine: 1.035 — ABNORMAL HIGH (ref 1.005–1.030)
Squamous Epithelial / HPF: NONE SEEN (ref 0–5)
pH: 6 (ref 5.0–8.0)

## 2022-07-03 LAB — PROTIME-INR
INR: 1.3 — ABNORMAL HIGH (ref 0.8–1.2)
Prothrombin Time: 15.6 seconds — ABNORMAL HIGH (ref 11.4–15.2)

## 2022-07-03 LAB — APTT: aPTT: 27 seconds (ref 24–36)

## 2022-07-03 LAB — LACTIC ACID, PLASMA: Lactic Acid, Venous: 1.3 mmol/L (ref 0.5–1.9)

## 2022-07-03 LAB — RESP PANEL BY RT-PCR (RSV, FLU A&B, COVID)  RVPGX2
Influenza A by PCR: NEGATIVE
Influenza B by PCR: NEGATIVE
Resp Syncytial Virus by PCR: NEGATIVE
SARS Coronavirus 2 by RT PCR: NEGATIVE

## 2022-07-03 MED ORDER — LACTATED RINGERS IV SOLN: INTRAVENOUS | Status: DC
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
## 2022-07-03 NOTE — Progress Notes (Cosign Needed)
Pt in too much pain after meds was given; pt unable to hold still long enough for scan; pt stated wanted to wait til next day.
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
## 2022-07-03 NOTE — ED Notes (Signed)
Patient provided with sandwich, chips, and a drink per request and patient stating "I havent eaten anything in 2 days." Prn pain medication administered as well.
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
## 2022-07-03 NOTE — ED Notes (Signed)
Patient resting comfortably at this time.

## 2022-07-04 ENCOUNTER — Inpatient Hospital Stay: Payer: Self-pay

## 2022-07-04 DIAGNOSIS — A4101 Sepsis due to Methicillin susceptible Staphylococcus aureus: Secondary | ICD-10-CM

## 2022-07-04 DIAGNOSIS — B9562 Methicillin resistant Staphylococcus aureus infection as the cause of diseases classified elsewhere: Secondary | ICD-10-CM

## 2022-07-04 DIAGNOSIS — S32028A Other fracture of second lumbar vertebra, initial encounter for closed fracture: Secondary | ICD-10-CM

## 2022-07-04 DIAGNOSIS — E871 Hypo-osmolality and hyponatremia: Secondary | ICD-10-CM

## 2022-07-04 DIAGNOSIS — R7881 Bacteremia: Secondary | ICD-10-CM

## 2022-07-04 LAB — BLOOD CULTURE ID PANEL (REFLEXED) - BCID2

## 2022-07-04 LAB — HEPATITIS PANEL, ACUTE
HCV Ab: NONREACTIVE
Hep A IgM: NONREACTIVE
Hep B C IgM: REACTIVE — AB
Hepatitis B Surface Ag: NONREACTIVE

## 2022-07-04 LAB — BASIC METABOLIC PANEL
Anion gap: 10 (ref 5–15)
BUN: 13 mg/dL (ref 6–20)
CO2: 25 mmol/L (ref 22–32)
Calcium: 8.3 mg/dL — ABNORMAL LOW (ref 8.9–10.3)
Chloride: 97 mmol/L — ABNORMAL LOW (ref 98–111)
Creatinine, Ser: 0.59 mg/dL — ABNORMAL LOW (ref 0.61–1.24)
GFR, Estimated: >60 mL/min (ref >60)
Glucose, Bld: 107 mg/dL — ABNORMAL HIGH (ref 70–99)
Potassium: 4.5 mmol/L (ref 3.5–5.1)
Sodium: 132 mmol/L — ABNORMAL LOW (ref 135–145)

## 2022-07-04 LAB — BRAIN NATRIURETIC PEPTIDE: B Natriuretic Peptide: 30.3 pg/mL (ref 0.0–100.0)

## 2022-07-04 LAB — MAGNESIUM: Magnesium: 2.3 mg/dL (ref 1.7–2.4)

## 2022-07-04 LAB — PROCALCITONIN: Procalcitonin: 0.55 ng/mL

## 2022-07-04 LAB — LACTIC ACID, PLASMA: Lactic Acid, Venous: 0.9 mmol/L (ref 0.5–1.9)

## 2022-07-04 MED ORDER — VANCOMYCIN HCL 1250 MG/250ML IV SOLN
1250.0000 mg | Freq: Two times a day (BID) | INTRAVENOUS | Status: DC
  Administered 2022-07-04 – 2022-07-07 (×8): 1250 mg via INTRAVENOUS
  Filled 2022-07-04 (×9): qty 250

## 2022-07-04 MED ORDER — VANCOMYCIN HCL IN DEXTROSE 1-5 GM/200ML-% IV SOLN
1000.0000 mg | Freq: Once | INTRAVENOUS | Status: AC
  Administered 2022-07-04: 1000 mg via INTRAVENOUS
  Filled 2022-07-04: qty 200

## 2022-07-04 NOTE — Consult Note (Signed)
Pharmacy Antibiotic Note  David Bean is a 46 y.o. male admitted on 07/02/2022 with MRSA bacteremia.  Pharmacy has been consulted for Vancomycin dosing.  Plan: Initiate Vancomycin 1000 mg X 1 followed by 1250 mg Q12H. Goal AUC 400-600 Estimated AUC 482 Scr 0.8, IBW, Vd 0.72  Vancomycin levels at steady state  Height: 5\' 9"  (175.3 cm) Weight: 72.6 kg (160 lb) IBW/kg (Calculated) : 70.7  Temp (24hrs), Avg:98.9 F (37.2 C), Min:98.3 F (36.8 C), Max:101 F (38.3 C)  Recent Labs  Lab 07/02/22 1557 07/03/22 0244 07/03/22 2037 07/04/22 0458  WBC 7.5  --   --   --   CREATININE 0.60* 0.69  --  0.59*  LATICACIDVEN  --   --  1.3 0.9    Estimated Creatinine Clearance: 114.2 mL/min (A) (by C-G formula based on SCr of 0.59 mg/dL (L)).    Allergies  Allergen Reactions   Aspirin Anaphylaxis    Antimicrobials this admission: 12/24 Vancomycin >>   Dose adjustments this admission:   Microbiology results: 12/23 BCx: 2/4 GPC: MRSA   Thank you for allowing pharmacy to be a part of this patient's care.  1/24, PharmD, BCPS Clinical Pharmacist   07/04/2022 9:19 AM
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
## 2022-07-04 NOTE — Progress Notes (Signed)
PHARMACY - PHYSICIAN COMMUNICATION CRITICAL VALUE ALERT - BLOOD CULTURE IDENTIFICATION (BCID)  David Bean is an 46 y.o. male who presented to [facility] on 07/02/2022 with a chief complaint of back pain.  Assessment:  2/4 GPC: BCID: MRSA, possible sources include vertebra, patient also complained of swollen/painful scrotum  Name of physician (or Provider) Contacted: Dr. Chipper Herb  Current antibiotics: none  Changes to prescribed antibiotics recommended: initiate Vancomycin  Recommendations accepted by provider  Results for orders placed or performed during the hospital encounter of 07/02/22  Blood Culture ID Panel (Reflexed) (Collected: 07/03/2022  6:45 PM)  Result Value Ref Range   Enterococcus faecalis NOT DETECTED NOT DETECTED   Enterococcus Faecium NOT DETECTED NOT DETECTED   Listeria monocytogenes NOT DETECTED NOT DETECTED   Staphylococcus species DETECTED (A) NOT DETECTED   Staphylococcus aureus (BCID) DETECTED (A) NOT DETECTED   Staphylococcus epidermidis NOT DETECTED NOT DETECTED   Staphylococcus lugdunensis NOT DETECTED NOT DETECTED   Streptococcus species NOT DETECTED NOT DETECTED   Streptococcus agalactiae NOT DETECTED NOT DETECTED   Streptococcus pneumoniae NOT DETECTED NOT DETECTED   Streptococcus pyogenes NOT DETECTED NOT DETECTED   A.calcoaceticus-baumannii NOT DETECTED NOT DETECTED   Bacteroides fragilis NOT DETECTED NOT DETECTED   Enterobacterales NOT DETECTED NOT DETECTED   Enterobacter cloacae complex NOT DETECTED NOT DETECTED   Escherichia coli NOT DETECTED NOT DETECTED   Klebsiella aerogenes NOT DETECTED NOT DETECTED   Klebsiella oxytoca NOT DETECTED NOT DETECTED   Klebsiella pneumoniae NOT DETECTED NOT DETECTED   Proteus species NOT DETECTED NOT DETECTED   Salmonella species NOT DETECTED NOT DETECTED   Serratia marcescens NOT DETECTED NOT DETECTED   Haemophilus influenzae NOT DETECTED NOT DETECTED   Neisseria meningitidis NOT DETECTED NOT DETECTED    Pseudomonas aeruginosa NOT DETECTED NOT DETECTED   Stenotrophomonas maltophilia NOT DETECTED NOT DETECTED   Candida albicans NOT DETECTED NOT DETECTED   Candida auris NOT DETECTED NOT DETECTED   Candida glabrata NOT DETECTED NOT DETECTED   Candida krusei NOT DETECTED NOT DETECTED   Candida parapsilosis NOT DETECTED NOT DETECTED   Candida tropicalis NOT DETECTED NOT DETECTED   Cryptococcus neoformans/gattii NOT DETECTED NOT DETECTED   Meth resistant mecA/C and MREJ DETECTED (A) NOT DETECTED    Sharen Hones, PharmD, BCPS Clinical Pharmacist   07/04/2022  12:07 PM
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
## 2022-07-05 LAB — PROCALCITONIN: Procalcitonin: 0.48 ng/mL

## 2022-07-05 LAB — BASIC METABOLIC PANEL
Anion gap: 7 (ref 5–15)
BUN: 13 mg/dL (ref 6–20)
CO2: 27 mmol/L (ref 22–32)
Calcium: 8.1 mg/dL — ABNORMAL LOW (ref 8.9–10.3)
Chloride: 98 mmol/L (ref 98–111)
Creatinine, Ser: 0.6 mg/dL — ABNORMAL LOW (ref 0.61–1.24)
GFR, Estimated: >60 mL/min (ref >60)
Glucose, Bld: 110 mg/dL — ABNORMAL HIGH (ref 70–99)
Potassium: 4.5 mmol/L (ref 3.5–5.1)
Sodium: 132 mmol/L — ABNORMAL LOW (ref 135–145)

## 2022-07-05 LAB — HIV ANTIBODY (ROUTINE TESTING W REFLEX): HIV Screen 4th Generation wRfx: NONREACTIVE

## 2022-07-05 MED ORDER — ACETAMINOPHEN 325 MG PO TABS
650.0000 mg | ORAL_TABLET | ORAL | Status: AC
  Administered 2022-07-05: 650 mg via ORAL
  Filled 2022-07-05: qty 2

## 2022-07-05 MED ORDER — FLUTICASONE PROPIONATE 50 MCG/ACT NA SUSP
1.0000 | Freq: Two times a day (BID) | NASAL | Status: DC
  Administered 2022-07-05 – 2022-08-06 (×29): 1 via NASAL
  Filled 2022-07-05: qty 16

## 2022-07-05 MED ORDER — ACETAMINOPHEN 325 MG PO TABS
650.0000 mg | ORAL_TABLET | Freq: Four times a day (QID) | ORAL | Status: DC | PRN
  Administered 2022-07-05 – 2022-07-06 (×3): 650 mg via ORAL
  Filled 2022-07-05 (×3): qty 2
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
## 2022-07-05 NOTE — Consult Note (Cosign Needed Addendum)
Patient refused consult for suicidal ideation, initiated by Dr. Chipper Herb. Writer approached patient in his room. He was initially sleeping, but woke after calling his name. Writer stated name and purpose for being there to visit with him. Patient said to writer "Fuck, no, I do not need psychiatry; I am not suicidal." Writer attempted to engage patient in more conversation, asking if he wanted to talk about any concerns and asked if he would like some information about outpatient mental health resources. Patient declined. Writer informed Dr. Chipper Herb of conversation.   Of note, patient's bedside RN informed writer patient has not made any suicidal comments to him today; did say that he was depressed about homelessness, being in the hospital, especially this time of year.  Vanetta Mulders, PMHNP

## 2022-07-05 NOTE — Plan of Care (Signed)

## 2022-07-06 ENCOUNTER — Inpatient Hospital Stay (HOSPITAL_COMMUNITY)
Admit: 2022-07-06 | Discharge: 2022-07-06 | Disposition: A | Discharge status: Home / Self Care | Payer: Self-pay | Attending: Internal Medicine | Admitting: Internal Medicine

## 2022-07-06 DIAGNOSIS — N433 Hydrocele, unspecified: Secondary | ICD-10-CM

## 2022-07-06 DIAGNOSIS — S32020A Wedge compression fracture of second lumbar vertebra, initial encounter for closed fracture: Secondary | ICD-10-CM

## 2022-07-06 DIAGNOSIS — F1510 Other stimulant abuse, uncomplicated: Secondary | ICD-10-CM

## 2022-07-06 DIAGNOSIS — R7881 Bacteremia: Secondary | ICD-10-CM

## 2022-07-06 LAB — RESP PANEL BY RT-PCR (RSV, FLU A&B, COVID)  RVPGX2
Influenza A by PCR: NEGATIVE
Influenza B by PCR: NEGATIVE
Resp Syncytial Virus by PCR: NEGATIVE
SARS Coronavirus 2 by RT PCR: NEGATIVE

## 2022-07-06 LAB — ECHOCARDIOGRAM COMPLETE
AR max vel: 2.84 cm2
AV Area VTI: 3.13 cm2
AV Area mean vel: 2.91 cm2
AV Mean grad: 6 mmHg
AV Peak grad: 10.5 mmHg
Ao pk vel: 1.62 m/s
Area-P 1/2: 5.02 cm2
Height: 69 in
S' Lateral: 3.3 cm
Weight: 2560 oz

## 2022-07-06 LAB — CULTURE, BLOOD (ROUTINE X 2)
Special Requests: ADEQUATE
Special Requests: ADEQUATE

## 2022-07-06 LAB — CREATININE, SERUM
Creatinine, Ser: 0.62 mg/dL (ref 0.61–1.24)
GFR, Estimated: >60 mL/min (ref >60)

## 2022-07-06 LAB — VANCOMYCIN, TROUGH: Vancomycin Tr: 6 ug/mL — ABNORMAL LOW (ref 15–20)

## 2022-07-06 LAB — HEMOGLOBIN A1C
Hgb A1c MFr Bld: 5.8 % — ABNORMAL HIGH (ref 4.8–5.6)
Mean Plasma Glucose: 120 mg/dL

## 2022-07-06 MED ORDER — HYDROMORPHONE HCL 1 MG/ML IJ SOLN
0.5000 mg | INTRAMUSCULAR | Status: DC | PRN
  Administered 2022-07-06 – 2022-07-08 (×3): 0.5 mg via INTRAVENOUS
  Filled 2022-07-06 (×3): qty 1

## 2022-07-06 MED ORDER — IPRATROPIUM-ALBUTEROL 0.5-2.5 (3) MG/3ML IN SOLN: 3.0000 mL | Freq: Four times a day (QID) | RESPIRATORY_TRACT | Status: DC | PRN

## 2022-07-06 MED ORDER — GUAIFENESIN 100 MG/5ML PO LIQD
5.0000 mL | ORAL | Status: DC | PRN
  Filled 2022-07-06: qty 5
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
## 2022-07-06 NOTE — Progress Notes (Signed)
Provided housing resources for the patient and included in AVS to print at DC

## 2022-07-06 NOTE — Consult Note (Signed)
Pharmacy Antibiotic Note  David Bean is a 46 y.o. male admitted on 07/02/2022 with MRSA bacteremia.  Pharmacy has been consulted for Vancomycin dosing.  Plan: Initiate Vancomycin 1000 mg X 1 followed by 1250 mg Q12H. Goal AUC 400-600 Estimated AUC 482 Scr 0.8, IBW, Vd 0.72  Vancomycin levels this evening. First shift pharmacist to assess regimen 07/07/22 at 0800.  Height: 5\' 9"  (175.3 cm) Weight: 72.6 kg (160 lb) IBW/kg (Calculated) : 70.7  Temp (24hrs), Avg:101.4 F (38.6 C), Min:98.8 F (37.1 C), Max:102.7 F (39.3 C)  Recent Labs  Lab 07/02/22 1557 07/03/22 0244 07/03/22 2037 07/04/22 0458 07/05/22 0419  WBC 7.5  --   --   --   --   CREATININE 0.60* 0.69  --  0.59* 0.60*  LATICACIDVEN  --   --  1.3 0.9  --      Estimated Creatinine Clearance: 114.2 mL/min (A) (by C-G formula based on SCr of 0.6 mg/dL (L)).    Allergies  Allergen Reactions   Aspirin Anaphylaxis    Antimicrobials this admission: 12/24 Vancomycin >>   Dose adjustments this admission:   Microbiology results: 12/23 BCx: 2/4 GPC: MRSA   Thank you for allowing pharmacy to be a part of this patient's care.  1/24, PharmD, BCPS Clinical Pharmacist   07/06/2022 7:59 AM
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
## 2022-07-06 NOTE — Plan of Care (Signed)
  Problem: Education: Goal: Knowledge of General Education information will improve Description: Including pain rating scale, medication(s)/side effects and non-pharmacologic comfort measures 07/06/2022 0945 by Orma Render, RN Outcome: Progressing 07/06/2022 0945 by Orma Render, RN Outcome: Progressing   Problem: Health Behavior/Discharge Planning: Goal: Ability to manage health-related needs will improve 07/06/2022 0945 by Orma Render, RN Outcome: Progressing 07/06/2022 0945 by Orma Render, RN Outcome: Progressing   Problem: Clinical Measurements: Goal: Ability to maintain clinical measurements within normal limits will improve 07/06/2022 0945 by Orma Render, RN Outcome: Progressing 07/06/2022 0945 by Orma Render, RN Outcome: Progressing Goal: Will remain free from infection 07/06/2022 0945 by Orma Render, RN Outcome: Progressing 07/06/2022 0945 by Orma Render, RN Outcome: Progressing Goal: Diagnostic test results will improve 07/06/2022 0945 by Orma Render, RN Outcome: Progressing 07/06/2022 0945 by Orma Render, RN Outcome: Progressing Goal: Respiratory complications will improve 07/06/2022 0945 by Orma Render, RN Outcome: Progressing 07/06/2022 0945 by Orma Render, RN Outcome: Progressing Goal: Cardiovascular complication will be avoided 07/06/2022 0945 by Orma Render, RN Outcome: Progressing 07/06/2022 0945 by Orma Render, RN Outcome: Progressing   Problem: Activity: Goal: Risk for activity intolerance will decrease 07/06/2022 0945 by Orma Render, RN Outcome: Progressing 07/06/2022 0945 by Orma Render, RN Outcome: Progressing   Problem: Nutrition: Goal: Adequate nutrition will be maintained 07/06/2022 0945 by Orma Render, RN Outcome: Progressing 07/06/2022 0945 by Orma Render, RN Outcome: Progressing   Problem: Coping: Goal: Level of anxiety will decrease 07/06/2022 0945 by Orma Render, RN Outcome:  Progressing 07/06/2022 0945 by Orma Render, RN Outcome: Progressing   Problem: Elimination: Goal: Will not experience complications related to bowel motility 07/06/2022 0945 by Orma Render, RN Outcome: Progressing 07/06/2022 0945 by Orma Render, RN Outcome: Progressing Goal: Will not experience complications related to urinary retention 07/06/2022 0945 by Orma Render, RN Outcome: Progressing 07/06/2022 0945 by Orma Render, RN Outcome: Progressing   Problem: Pain Managment: Goal: General experience of comfort will improve 07/06/2022 0945 by Orma Render, RN Outcome: Progressing 07/06/2022 0945 by Orma Render, RN Outcome: Progressing   Problem: Safety: Goal: Ability to remain free from injury will improve 07/06/2022 0945 by Orma Render, RN Outcome: Progressing 07/06/2022 0945 by Orma Render, RN Outcome: Progressing   Problem: Skin Integrity: Goal: Risk for impaired skin integrity will decrease 07/06/2022 0945 by Orma Render, RN Outcome: Progressing 07/06/2022 0945 by Orma Render, RN Outcome: Progressing
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
## 2022-07-06 NOTE — Progress Notes (Signed)
*  PRELIMINARY RESULTS* Echocardiogram 2D Echocardiogram has been performed.  Joanette Gula Aubry Tucholski 07/06/2022, 9:07 AM

## 2022-07-06 NOTE — Plan of Care (Signed)

## 2022-07-06 NOTE — Discharge Instructions (Signed)
Rent/Utility/Housing  Agency Name: [facility] Agency Address: [address] [place], [place] [address] Phone: [phone] Email: [email or web address] Website: [email or web address] Service(s) Offered: Housing services, self-sufficiency, congregate meal  program, weatherization program, Administrator, sports program, emergency food assistance,  housing counseling, home ownership program, wheels -towork program.  Agency Name: [place] Address: [id number], [place], [place] [address] Phone: [phone] (8a-[phone] (8p- 10p) Email: [email or web address] Website: [email or web address] Service(s) Offered: A program for homeless and/or needy men that includes one-on-one counseling, life skills training and job rehabilitation.  Agency Name: [employer] [facility] [place] Address: [address] [place], [place] [address] Phone: [phone] Website: www.alliedchurches.org Service(s) Offered: Assistance to needy in emergency with utility bills, heating  fuel, and prescriptions. Shelter for homeless 7pm-7am. November 04, 2016 15  Agency Name: [facility] [place] (Developmentally Disabled) Address: [address], [place], [place] [address] Phone: [phone] Contact Person: Susanne Greenhouse Email: [email or web address] Website: [email or web address] Service(s) Offered: Helps individuals with developmental disabilities move  from housing that is more restrictive to homes where they  can achieve greater independence and have more  opportunities.  Agency Name: [employer] Address: [address], [place], [place] [address] Phone: [phone] Email: [email or web address] Website: www.[email or web address].org Service(s) Offered: Provides affordable housing for low-income families,  elderly, and disabled individuals. Offer a wide range of  programs and services, from financial planning  to afterschool and summer programs.  Agency Name: [place] Address: [address] [place], [place] [address] Phone: [phone] Service(s) Offered: Child support services; child welfare services; food stamps;  Medicaid; work first family assistance; and aid with fuel,  rent, food and medicine.  Agency Name: [facility] [place]. Address: [facility] [address], [place], [place]  [address] Phone: [phone] Website: www.familyabuseservices.org Service(s) Offered: 24 hour Crisis Line: [phone]; 24 hour Emergency Shelter;  Transitional Housing; Support Groups; Lexicographer;  [employer]; Hispanic Outreach: [phone];  [place]: [phone]. November 04, 2016 16  Agency Name: [facility] Address: [address] [place], [place] [address] Phone: [phone] Service(s) Offered: CAP Services; Home and [employer]; Individual  or Group Supports; Respite Care Non-Institutional Nursing;  Residential Supports; Respite Care and [place]; Transportation; Family and Friends Night;  Recreational Activities; Three Nutritious Meals/Snacks;  Consultation with Registered Dietician; Twenty-four hour  Registered Nurse Access; Daily and [employer]; Camp [place]aves; [place] for the [employer] (During Summer Months) Bingo Night (Every  Wednesday Night); Special Populations Dance Night  (Every Tuesday Night); Professional Hair Care Services.  Agency Name: [facility] Address: [address], [place], [place] [address] Phone: [phone] Contact Person: Flonnie Hailstone Website: [email or web address].[email or web address], hospital) Offered: Residential treatment facility for women; food and  clothing, educational & employment development and  transportation to work; Psychologist, counselling of financial skills;  parenting and family  reunification; emotional and spiritual  support; transitional housing for program graduates.  Agency Name: [employer] Address: [address], [place], [place] [address] Phone: [phone] Email: [email or web address] Website: [email or web address]://www.[email or web address]/ Service(s) Offered: Public housing units for elderly, disabled, and low income  people; housing choice vouchers for income eligible  applicants; shelter plus care vouchers; and [employer] program. November 04, 2016 17  Agency Name: [facility] Address: [place] [address] [place], [place] [address] Phone: [phone] Email: [email or web address] Website: www.habitatalamance.org Service(s) Offered: Build houses for families in need of decent housing. Each  adult in the family must invest 200 hours of labor on  someone else's house, work with volunteers to build their  own house, attend classes on budgeting, home maintenance, yard care, and attend homeowner association  meetings.  Agency Name: [facility] Address: [address] [place], [place] [address] Phone: [phone] Website: www.rsli.org Service(s) Offered: Intermediate care facilities for mentally retarded,  Supervised Living in group homes for adults with  developmental disabilities, Supervised Living for people  who have dual diagnoses (MRMI), Independent Living,  Supported Living, respite and a variety of CAP services,  pre-vocational services, day supports, and [employer].  Agency Name: [place] [employer] Phone: [phone] Website: www.NCForeclosurePrevention.gov Service(s) Offered: Zero-interest, deferred loans to homeowners struggling to  pay their mortgage. Call for more information    Transportation Agency Name: [facility] Agency Address: [address] [place], [place] [address] Phone: [phone] Email: [email or web address] Website:  www.alamanceservices.org Service(s) Offered: Family [employer], self-sufficiency, congregate meal  program, weatherization program, Field seismologist program, emergency food assistance,  housing counseling, home ownership program, wheels-towork program. November 04, 2016 22  Agency Name: [facility] [employer] [phone]) Address: [address], [place], [place] [address] Phone: [phone] Email:  Website: www.acta-Micro.com Service(s) Offered: Transportation for general public, subscription and demand  response; Dial-a-Ride for citizens 47 years of age or older. Agency Name: Department of Social Services Address: [address] [place], [place] [address] Phone: [phone] Service(s) Offered: Child support services; child welfare services; food stamps;  Medicaid; work first family assistance; and aid with fuel,  rent, food and medicine, transportation assistance.  Agency Name: Disabled [employer] (DAV) Transportation  Network Address: Phone: [phone] Service(s) Offered: Transports veterans to the [facility] medical center. Call  forty-eight hours in advance and leave the name, telephone  number, date, and time of appointment. Veteran will be  contacted by the driver the day before the appointment to  arrange a pick up point    Intensive Outpatient Programs   [facility] [address] [place],  [place], [place] [phone]  [facility] (Inpatient and outpatient)[phone] (Suboxone and Methadone) [address] [phone]  ADS: Alcohol & Drug [facility] Programs - Intensive Outpatient [address] [place], [place] [address], [place]  [phone]  [facility] (Outpatient, Inpatient, [facility] (Groups and  Residental) (insurance only) [phone] [place], [place] [phone]   [facility] (for caregivers and family) [address] Laurell Josephs [address] [place], [place] [phone]  Residential Treatment Programs  [facility](2 years) Residential: 43 days)[facility] [address] [facility] [address] [place], [place], [place] [phone] or [phone]  [facility] [address] [place], [place], [place] [phone]  [facility] ([facility]) [address] [place], [place], [place] [phone] Admissions: 8am-3pm M-F  [facility] Program: Residential Program [phone] Days)             [facility]: [facility]  [place], [place], [place]  [phone] or [phone] in Hours over the weekend or by referral)   Mobil Crisis: [facility] Alternatives:[phone] (for crisis response 24 hours a day)

## 2022-07-06 NOTE — Progress Notes (Signed)
Met with the patient in the room,  He would not make eye contact but stated her hears me, he is alert and oriented X 4  He stated that he has no plan at [place], He does not have Insurance I provided him with [facility] application and explained that he would need to call to set up and application appointment I explained to him how to get medications at [facility] He stated understanding I asked him what his plan was at [place] and he said to walk out the door and down the road like always, I explained that I included low income housing to his DC paper work and his response was good luck with that, He stated he did not need anything else

## 2022-07-07 LAB — RPR: RPR Ser Ql: NONREACTIVE

## 2022-07-07 LAB — VANCOMYCIN, TROUGH: Vancomycin Tr: 6 ug/mL — ABNORMAL LOW (ref 15–20)

## 2022-07-07 MED ORDER — SODIUM CHLORIDE 0.9 % IV SOLN: INTRAVENOUS | Status: DC

## 2022-07-07 NOTE — Progress Notes (Signed)
   [facility] has been requested to perform a transesophageal echocardiogram on David Bean for MRSA bacteremia.  After careful review of history and examination, the risks and benefits of transesophageal echocardiogram have been explained including risks of esophageal damage, perforation (1:10,000 risk), bleeding, pharyngeal hematoma as well as other potential complications associated with anesthesia including aspiration, arrhythmia, respiratory failure and death. Alternatives to treatment were discussed, questions were answered. Patient is willing to proceed.   Case is scheduled for 12/29 at 08:00.    2D echo 07/06/2022: 1. Left ventricular ejection fraction, by estimation, is 55 to 60%. The  left ventricle has normal function. The left ventricle has no regional  wall motion abnormalities. There is mild left ventricular hypertrophy.  Left ventricular diastolic parameters  were normal.   2. Right ventricular systolic function is normal. The right ventricular  size is normal. Tricuspid regurgitation signal is inadequate for assessing  PA pressure.   3. The mitral valve is normal in structure. No evidence of mitral valve  regurgitation. No evidence of mitral stenosis.   4. The aortic valve is normal in structure. Aortic valve regurgitation is  not visualized. No aortic stenosis is present.   5. The inferior vena cava is dilated in size with >50% respiratory  variability, suggesting right atrial pressure of 8 mmHg.     Eula Listen, PA-C 07/07/2022 11:16 AM
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
## 2022-07-07 NOTE — Progress Notes (Signed)
Orthopedic Tech Progress Note Patient Details:  David Bean 1976/01/20 [id number] LSO brace has been ordered from [facility]  Patient ID: David Bean, male   DOB: [birth year], 46 y.o.   MRN: [id number]  David Bean 07/07/2022, 2:21 PM
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
## 2022-07-07 NOTE — Plan of Care (Signed)
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
## 2022-07-08 ENCOUNTER — Inpatient Hospital Stay
Admit: 2022-07-08 | Discharge: 2022-07-08 | Disposition: A | Discharge status: Home / Self Care | Payer: Self-pay | Attending: Physician Assistant | Admitting: Physician Assistant

## 2022-07-08 DIAGNOSIS — F322 Major depressive disorder, single episode, severe without psychotic features: Secondary | ICD-10-CM | POA: Insufficient documentation

## 2022-07-08 LAB — CULTURE, BLOOD (ROUTINE X 2)
Special Requests: ADEQUATE
Special Requests: ADEQUATE

## 2022-07-08 LAB — URINE DRUG SCREEN, QUALITATIVE (ARMC ONLY)
Amphetamines, Ur Screen: NOT DETECTED
Barbiturates, Ur Screen: NOT DETECTED
Benzodiazepine, Ur Scrn: NOT DETECTED
Cannabinoid 50 Ng, Ur ~~LOC~~: NOT DETECTED
Cocaine Metabolite,Ur ~~LOC~~: NOT DETECTED
MDMA (Ecstasy)Ur Screen: NOT DETECTED
Methadone Scn, Ur: NOT DETECTED
Opiate, Ur Screen: POSITIVE — AB
Phencyclidine (PCP) Ur S: NOT DETECTED
Tricyclic, Ur Screen: NOT DETECTED

## 2022-07-08 LAB — VANCOMYCIN, PEAK: Vancomycin Pk: 20 ug/mL — ABNORMAL LOW (ref 30–40)

## 2022-07-08 MED ORDER — VANCOMYCIN HCL 1250 MG/250ML IV SOLN: 1250.0000 mg | Freq: Three times a day (TID) | INTRAVENOUS | Status: DC

## 2022-07-08 MED ORDER — CITALOPRAM HYDROBROMIDE 10 MG PO TABS
20.0000 mg | ORAL_TABLET | Freq: Every day | ORAL | Status: DC
  Administered 2022-07-08 – 2022-07-15 (×6): 20 mg via ORAL
  Filled 2022-07-08 (×9): qty 2

## 2022-07-08 MED ORDER — VANCOMYCIN HCL 1250 MG/250ML IV SOLN
1250.0000 mg | Freq: Three times a day (TID) | INTRAVENOUS | Status: DC
  Administered 2022-07-08 – 2022-07-12 (×14): 1250 mg via INTRAVENOUS
  Filled 2022-07-08 (×17): qty 250

## 2022-07-08 NOTE — Progress Notes (Signed)
Person is alert and oriented x4. Moans and curses inside of room but fails to call for help or for pain meds. Did demonstrate use of call light but will not call the call light and yells instead. He remains irritable. Gave pain meds several times throughout the night to help with pain. Refused to receive a bath when offered this morning. Can ambulate with steady gate demonstrated by him coming to his door and yelling down the hall.

## 2022-07-08 NOTE — TOC Progression Note (Signed)
Transition of Care [facility]) - Progression Note    Patient Details  Name: CAMEO SHEWELL MRN: [id number] Date of Birth: 17-Jan-1976  Transition of Care [facility]) CM/SW Contact  Marlowe Sax, RN Phone Number: 07/08/2022, 9:01 AM  Clinical Narrative:    Added Transportation resources to AVS to print at DC, The patient remains inpatient for IV ABX, TOC will continue to follow for Further needs   Expected Discharge Plan: Homeless Shelter Barriers to Discharge: Homeless with medical needs  Expected Discharge Plan and Services   Discharge Planning Services: CM Consult   Living arrangements for the past 2 months: Homeless                                       Social Determinants of Health (SDOH) Interventions SDOH Screenings   Food Insecurity: No Food Insecurity (07/03/2022)  Housing: Low Risk  (07/03/2022)  Transportation Needs: No Transportation Needs (07/03/2022)  Utilities: Not At Risk (07/03/2022)  Tobacco Use: High Risk (07/02/2022)    Readmission Risk Interventions     No data to display

## 2022-07-08 NOTE — Progress Notes (Addendum)
Added resources for transportation as well as Drug use resources to the AVS
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
## 2022-07-08 NOTE — Progress Notes (Signed)
Added Transportation resources for him to print on AVS at DC

## 2022-07-08 NOTE — Progress Notes (Signed)
Added Housing resources to print at DC

## 2022-07-09 ENCOUNTER — Inpatient Hospital Stay (HOSPITAL_COMMUNITY)
Admit: 2022-07-09 | Discharge: 2022-07-09 | Disposition: A | Discharge status: Home / Self Care | Payer: Self-pay | Attending: Physician Assistant | Admitting: Physician Assistant

## 2022-07-09 ENCOUNTER — Inpatient Hospital Stay: Payer: Self-pay | Admitting: Certified Registered Nurse Anesthetist

## 2022-07-09 ENCOUNTER — Encounter
Admission: EM | Discharge status: Against Medical Advice | Payer: Self-pay | Source: Home / Self Care | Attending: Internal Medicine

## 2022-07-09 DIAGNOSIS — R7881 Bacteremia: Secondary | ICD-10-CM

## 2022-07-09 DIAGNOSIS — I38 Endocarditis, valve unspecified: Secondary | ICD-10-CM

## 2022-07-09 HISTORY — PX: TEE WITHOUT CARDIOVERSION: SHX5443

## 2022-07-09 LAB — CULTURE, RESPIRATORY W GRAM STAIN

## 2022-07-09 LAB — CREATININE, SERUM
Creatinine, Ser: 0.51 mg/dL — ABNORMAL LOW (ref 0.61–1.24)
GFR, Estimated: >60 mL/min (ref >60)

## 2022-07-09 SURGERY — ECHOCARDIOGRAM, TRANSESOPHAGEAL
Anesthesia: General

## 2022-07-09 MED ORDER — PHENYLEPHRINE 80 MCG/ML (10ML) SYRINGE FOR IV PUSH (FOR BLOOD PRESSURE SUPPORT)
PREFILLED_SYRINGE | INTRAVENOUS | Status: DC | PRN
  Administered 2022-07-09 (×2): 80 ug via INTRAVENOUS
  Administered 2022-07-09: 160 ug via INTRAVENOUS

## 2022-07-09 MED ORDER — SALINE SPRAY 0.65 % NA SOLN
1.0000 | NASAL | Status: DC | PRN
  Administered 2022-07-09: 1 via NASAL
  Filled 2022-07-09: qty 44

## 2022-07-09 MED ORDER — BUTAMBEN-TETRACAINE-BENZOCAINE 2-2-14 % EX AERO
INHALATION_SPRAY | CUTANEOUS | Status: AC
  Administered 2022-07-09: 3
  Filled 2022-07-09: qty 5

## 2022-07-09 MED ORDER — MIDAZOLAM HCL 2 MG/2ML IJ SOLN
INTRAMUSCULAR | Status: DC | PRN
  Administered 2022-07-09: 2 mg via INTRAVENOUS

## 2022-07-09 MED ORDER — PROPOFOL 10 MG/ML IV BOLUS
INTRAVENOUS | Status: DC | PRN
  Administered 2022-07-09: 50 mg via INTRAVENOUS
  Administered 2022-07-09: 60 mg via INTRAVENOUS
  Administered 2022-07-09: 90 mg via INTRAVENOUS

## 2022-07-09 MED ORDER — PROPOFOL 10 MG/ML IV BOLUS
INTRAVENOUS | Status: AC
  Filled 2022-07-09: qty 40

## 2022-07-09 MED ORDER — MIDAZOLAM HCL 2 MG/2ML IJ SOLN
INTRAMUSCULAR | Status: AC
  Filled 2022-07-09: qty 2

## 2022-07-09 MED ORDER — DEXMEDETOMIDINE HCL IN NACL 80 MCG/20ML IV SOLN
INTRAVENOUS | Status: DC | PRN
  Administered 2022-07-09: 4 ug via BUCCAL
  Administered 2022-07-09 (×2): 8 ug via BUCCAL

## 2022-07-09 MED ORDER — LIDOCAINE VISCOUS HCL 2 % MT SOLN
OROMUCOSAL | Status: AC
  Administered 2022-07-09: 15 mL
  Filled 2022-07-09: qty 15
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
## 2022-07-09 NOTE — Anesthesia Postprocedure Evaluation (Signed)
Anesthesia Post Note  Patient: David Bean  Procedure(s) Performed: TRANSESOPHAGEAL ECHOCARDIOGRAM (TEE)  Patient location during evaluation: PACU Anesthesia Type: General Level of consciousness: awake and alert, oriented and patient cooperative Pain management: pain level controlled Vital Signs Assessment: post-procedure vital signs reviewed and stable Respiratory status: spontaneous breathing, nonlabored ventilation and respiratory function stable Cardiovascular status: blood pressure returned to baseline and stable Postop Assessment: adequate PO intake Anesthetic complications: no   No notable events documented.   Last Vitals:  Vitals:   07/09/22 0907 07/09/22 0915  BP: 96/63 101/61  Pulse: 73 78  Resp: 13 17  Temp:    SpO2: 93% 95%    Last Pain:  Vitals:   07/09/22 0915  TempSrc:   PainSc: 8                  Reed Breech

## 2022-07-09 NOTE — Progress Notes (Signed)
Transesophageal Echocardiogram :  Indication: bacteremia, rule out endocarditis Requesting/ordering  physician:  Chipper Herb  Procedure: Benzocaine spray x2 and 2 mls x 2 of viscous lidocaine were given orally to provide local anesthesia to the oropharynx. The patient was positioned supine on the left side, bite block provided. The patient was moderately sedated with the doses of versed and fentanyl as detailed below.  Using digital technique an omniplane probe was advanced into the distal esophagus without incident.   Moderate sedation: 1. Sedation used:  per anesthesia (general)  See report in EPIC  for complete details: In brief,  No valve vegetation/endocarditis noted transgastric imaging revealed normal LV function with no RWMAs and no mural apical thrombus.  .  Estimated ejection fraction was 50%.  Right sided cardiac chambers were normal with no evidence of pulmonary hypertension.  Imaging of the septum showed no ASD or VSD Bubble study was  slightly positive, unable to exclude small PFO, small number of bubbles crossing right to left  2D and color flow did not suggest a  PFO  The LA was well visualized in orthogonal views.  There was no spontaneous contrast and no thrombus in the LA and LA appendage   The descending thoracic aorta had no  mural aortic debris with no evidence of aneurysmal dilation or disection   David Bean 07/09/2022 8:52 AM

## 2022-07-09 NOTE — Progress Notes (Signed)
Patient refused to sign the consent for TEE and start cursing the Clinical research associate.

## 2022-07-09 NOTE — Plan of Care (Signed)
  Problem: Health Behavior/Discharge Planning: Goal: Ability to manage health-related needs will improve Outcome: Progressing   Problem: Clinical Measurements: Goal: Diagnostic test results will improve Outcome: Progressing   Problem: Activity: Goal: Risk for activity intolerance will decrease Outcome: Progressing   Problem: Elimination: Goal: Will not experience complications related to bowel motility Outcome: Progressing Goal: Will not experience complications related to urinary retention Outcome: Progressing   Problem: Pain Managment: Goal: General experience of comfort will improve Outcome: Progressing

## 2022-07-09 NOTE — Anesthesia Preprocedure Evaluation (Signed)
Anesthesia Evaluation    Airway Mallampati: IV   Neck ROM: Full    Dental  (+) Chipped   Pulmonary former smoker (current vaping)   Pulmonary exam normal breath sounds clear to auscultation       Cardiovascular Normal cardiovascular exam Rhythm:Regular Rate:Normal     Neuro/Psych  PSYCHIATRIC DISORDERS  Depression    Polysubstance use disorder; UDS positive for amphetamines on admission, now negative    GI/Hepatic   Endo/Other    Renal/GU      Musculoskeletal   Abdominal   Peds  Hematology   Anesthesia Other Findings MRSA septicemia  Reproductive/Obstetrics                              Anesthesia Physical Anesthesia Plan  ASA: 3  Anesthesia Plan: General   Post-op Pain Management:    Induction: Intravenous  PONV Risk Score and Plan: 1 and Propofol infusion, TIVA and Treatment may vary due to age or medical condition  Airway Management Planned: Natural Airway  Additional Equipment:   Intra-op Plan:   Post-operative Plan:   Informed Consent: I have reviewed the patients History and Physical, chart, labs and discussed the procedure including the risks, benefits and alternatives for the proposed anesthesia with the patient or authorized representative who has indicated his/her understanding and acceptance.       Plan Discussed with: CRNA  Anesthesia Plan Comments: (LMA/GETA backup discussed.  Patient consented for risks of anesthesia including but not limited to:  - adverse reactions to medications - damage to eyes, teeth, lips or other oral mucosa - nerve damage due to positioning  - sore throat or hoarseness - damage to heart, brain, nerves, lungs, other parts of body or loss of life  Informed patient about role of CRNA in peri- and intra-operative care.  Patient voiced understanding.)         Anesthesia Quick Evaluation

## 2022-07-09 NOTE — Progress Notes (Signed)
Patient is keep removing his cardiac monitor, the writer educate him why he need to be in tele monitor but instead he start cursing. The writer tried to put the tele several time but the patient keep removing it.
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
## 2022-07-09 NOTE — Transfer of Care (Signed)
Immediate Anesthesia Transfer of Care Note  Patient: David Bean  Procedure(s) Performed: TRANSESOPHAGEAL ECHOCARDIOGRAM (TEE)  Patient Location: Cath Lab  Anesthesia Type:MAC  Level of Consciousness: awake, alert , and oriented  Airway & Oxygen Therapy: Patient Spontanous Breathing and Patient connected to nasal cannula oxygen  Post-op Assessment: Report given to RN and Post -op Vital signs reviewed and stable  Post vital signs: Reviewed and stable  Last Vitals:  Vitals Value Taken Time  BP 93/63 07/09/22 0857  Temp    Pulse 82 07/09/22 0855  Resp 17 07/09/22 0857  SpO2 99 % 07/09/22 0855  Vitals shown include unvalidated device data.  Last Pain:  Vitals:   07/09/22 0749  TempSrc: Oral  PainSc: 8       Patients Stated Pain Goal: 0 (59/74/71 8550)  Complications: No notable events documented.

## 2022-07-09 NOTE — TOC Progression Note (Signed)
Transition of Care [facility]) - Progression Note    Patient Details  Name: David Bean MRN: [id number] Date of Birth: [birth year]  Transition of Care [facility]) CM/SW Contact  Marlowe Sax, RN Phone Number: 07/09/2022, 9:54 AM  Clinical Narrative:     Family in the room, I explained the [facility] application to her, I Provided a release of Information for for them to complete.  I Explained Med Mgt for medications  Expected Discharge Plan: Homeless Shelter Barriers to Discharge: Homeless with medical needs  Expected Discharge Plan and Services   Discharge Planning Services: CM Consult   Living arrangements for the past 2 months: Homeless                                       Social Determinants of Health (SDOH) Interventions SDOH Screenings   Food Insecurity: No Food Insecurity (07/03/2022)  Housing: Low Risk  (07/03/2022)  Transportation Needs: Unmet Transportation Needs (07/08/2022)  Utilities: Not At Risk (07/03/2022)  Tobacco Use: High Risk (07/02/2022)    Readmission Risk Interventions     No data to display

## 2022-07-09 NOTE — Progress Notes (Signed)
*  PRELIMINARY RESULTS* Echocardiogram Echocardiogram Transesophageal has been performed.  Cristela Blue 07/09/2022, 9:06 AM

## 2022-07-10 DIAGNOSIS — D75838 Other thrombocytosis: Secondary | ICD-10-CM | POA: Insufficient documentation

## 2022-07-10 DIAGNOSIS — D75839 Thrombocytosis, unspecified: Secondary | ICD-10-CM | POA: Insufficient documentation

## 2022-07-10 LAB — CBC WITH DIFFERENTIAL/PLATELET
Abs Immature Granulocytes: 0.05 10*3/uL (ref 0.00–0.07)
Basophils Absolute: 0.1 10*3/uL (ref 0.0–0.1)
Basophils Relative: 1 %
Eosinophils Absolute: 0.1 10*3/uL (ref 0.0–0.5)
Eosinophils Relative: 1 %
HCT: 33.7 % — ABNORMAL LOW (ref 39.0–52.0)
Hemoglobin: 10.9 g/dL — ABNORMAL LOW (ref 13.0–17.0)
Immature Granulocytes: 1 %
Lymphocytes Relative: 20 %
Lymphs Abs: 2.2 10*3/uL (ref 0.7–4.0)
MCH: 29.2 pg (ref 26.0–34.0)
MCHC: 32.3 g/dL (ref 30.0–36.0)
MCV: 90.3 fL (ref 80.0–100.0)
Monocytes Absolute: 0.8 10*3/uL (ref 0.1–1.0)
Monocytes Relative: 7 %
Neutro Abs: 7.7 10*3/uL (ref 1.7–7.7)
Neutrophils Relative %: 70 %
Platelets: 940 10*3/uL (ref 150–400)
RBC: 3.73 MIL/uL — ABNORMAL LOW (ref 4.22–5.81)
RDW: 13.6 % (ref 11.5–15.5)
WBC: 11 10*3/uL — ABNORMAL HIGH (ref 4.0–10.5)
nRBC: 0 % (ref 0.0–0.2)

## 2022-07-10 LAB — BASIC METABOLIC PANEL
Anion gap: 8 (ref 5–15)
BUN: 12 mg/dL (ref 6–20)
CO2: 22 mmol/L (ref 22–32)
Calcium: 8.5 mg/dL — ABNORMAL LOW (ref 8.9–10.3)
Chloride: 106 mmol/L (ref 98–111)
Creatinine, Ser: 0.49 mg/dL — ABNORMAL LOW (ref 0.61–1.24)
GFR, Estimated: >60 mL/min (ref >60)
Glucose, Bld: 101 mg/dL — ABNORMAL HIGH (ref 70–99)
Potassium: 4.6 mmol/L (ref 3.5–5.1)
Sodium: 136 mmol/L (ref 135–145)

## 2022-07-10 LAB — CBC
HCT: 34.2 % — ABNORMAL LOW (ref 39.0–52.0)
Hemoglobin: 11 g/dL — ABNORMAL LOW (ref 13.0–17.0)
MCH: 29 pg (ref 26.0–34.0)
MCHC: 32.2 g/dL (ref 30.0–36.0)
MCV: 90.2 fL (ref 80.0–100.0)
Platelets: 981 10*3/uL (ref 150–400)
RBC: 3.79 MIL/uL — ABNORMAL LOW (ref 4.22–5.81)
RDW: 13.7 % (ref 11.5–15.5)
WBC: 11 10*3/uL — ABNORMAL HIGH (ref 4.0–10.5)
nRBC: 0 % (ref 0.0–0.2)

## 2022-07-10 LAB — MAGNESIUM: Magnesium: 2.4 mg/dL (ref 1.7–2.4)

## 2022-07-10 NOTE — Progress Notes (Signed)
       CROSS COVER NOTE  NAME: David Bean MRN: [id number] DOB : [birth year]    HPI/Events of Note   Platelet count reported to me as 981 Last platelet count 07/02/22 315.   Assessment and  Interventions   Assessment: No prior documentation or thrombocytosis evident in review of prior labs Confirm results with 2 separate drawn specimens Plan: Likely reactive to bacteremia No evidence of bleeding or clotting Consider hematology consult      Donnie Mesa NP [facility]

## 2022-07-10 NOTE — Plan of Care (Signed)
  Problem: Clinical Measurements: Goal: Diagnostic test results will improve Outcome: Progressing   Problem: Activity: Goal: Risk for activity intolerance will decrease Outcome: Progressing   Problem: Nutrition: Goal: Adequate nutrition will be maintained Outcome: Progressing   Problem: Pain Managment: Goal: General experience of comfort will improve Outcome: Progressing   

## 2022-07-10 NOTE — Progress Notes (Signed)
Patient latest platelet is 981, NP B. Jon Billings notified.
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
## 2022-07-11 ENCOUNTER — Encounter: Payer: Self-pay | Admitting: Cardiovascular Disease

## 2022-07-11 DIAGNOSIS — D75838 Other thrombocytosis: Secondary | ICD-10-CM

## 2022-07-11 LAB — VANCOMYCIN, PEAK: Vancomycin Pk: 30 ug/mL (ref 30–40)

## 2022-07-11 LAB — VANCOMYCIN, TROUGH: Vancomycin Tr: 16 ug/mL (ref 15–20)

## 2022-07-11 NOTE — Discharge Summary (Addendum)
  Physician Discharge Summary   Patient is signing out against medical advice. I came to his room to talk with him, asked him to stay. Explained again the severity of his condition. He should not leave. But he is unwilling to stay. Want to sign out AMA. Currently waiting for his ex wife to pick him up. Hopefully his ex wife can convince him to stay.   Based on my conversation over the last few days, and opinion from psychiatry, patient has full capacity to make his decision. Therefore, I could not stop him from leaving. There is nothing appropriate for me to prescribe as outpatient for his condition. Advised to follow up with ID as outpatient.

## 2022-07-11 NOTE — Consult Note (Signed)
Pharmacy Antibiotic Note  David Bean is a 46 y.o. male admitted on 07/02/2022 with MRSA bacteremia and acute back pain with L2 vertebral fracture. PMH includes IV amphetamine abuse. Pharmacy has been consulted for vancomycin dosing.  Plan: Vancomycin peak 30 and vancomycin trough 16. Will continue vancomycin 1250 mg q8H. Calculated AUC 539. Goal AUC 400-600.       Height: 5\' 9"  (175.3 cm) Weight: 72.6 kg (160 lb) IBW/kg (Calculated) : 70.7  Temp (24hrs), Avg:98.1 F (36.7 C), Min:97.7 F (36.5 C), Max:98.4 F (36.9 C)  Recent Labs  Lab 07/05/22 0419 07/06/22 1008 07/06/22 2252 07/07/22 2308 07/08/22 0244 07/09/22 0612 07/10/22 0427 07/10/22 0711 07/11/22 0847 07/11/22 1246  WBC  --   --   --   --   --   --  11.0* 11.0*  --   --   CREATININE 0.60* 0.62  --   --   --  0.51* 0.49*  --   --   --   VANCOTROUGH  --   --    < > 6*  --   --   --   --   --  16  VANCOPEAK  --   --   --   --  20*  --   --   --  30  --    < > = values in this interval not displayed.     Estimated Creatinine Clearance: 114.2 mL/min (A) (by C-G formula based on SCr of 0.49 mg/dL (L)).    Allergies  Allergen Reactions   Aspirin Anaphylaxis    Antimicrobials this admission: 12/24 Vancomycin >>   Dose adjustments this admission: 12/28  Vanc adj from 1250 q12h to q8h  Microbiology results: 12/23 BCx: 2/4 GPC: MRSA (MIC < 0.15) 12/25 Bcx: MRSA 12/28 Bcx: NGTD 12/27 Sputum: few:  FEW HAEMOPHILUS INFLUENZAE  BETA LACTAMASE NEGATIVE   Thank you for allowing pharmacy to be a part of this patient's care.  1/28, PharmD Clinical Pharmacist   07/11/2022 2:02 PM
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
## 2022-07-12 LAB — TECHNOLOGIST SMEAR REVIEW: Plt Morphology: NORMAL

## 2022-07-12 LAB — RETIC PANEL
Immature Retic Fract: 19.8 % — ABNORMAL HIGH (ref 2.3–15.9)
RBC.: 3.68 MIL/uL — ABNORMAL LOW (ref 4.22–5.81)
Retic Count, Absolute: 128.4 10*3/uL (ref 19.0–186.0)
Retic Ct Pct: 3.5 % — ABNORMAL HIGH (ref 0.4–3.1)
Reticulocyte Hemoglobin: 33.2 pg (ref >27.9)

## 2022-07-12 LAB — CBC
HCT: 35.6 % — ABNORMAL LOW (ref 39.0–52.0)
Hemoglobin: 11.4 g/dL — ABNORMAL LOW (ref 13.0–17.0)
MCH: 29.2 pg (ref 26.0–34.0)
MCHC: 32 g/dL (ref 30.0–36.0)
MCV: 91 fL (ref 80.0–100.0)
Platelets: 1029 10*3/uL (ref 150–400)
RBC: 3.91 MIL/uL — ABNORMAL LOW (ref 4.22–5.81)
RDW: 13.9 % (ref 11.5–15.5)
WBC: 11.5 10*3/uL — ABNORMAL HIGH (ref 4.0–10.5)
nRBC: 0 % (ref 0.0–0.2)

## 2022-07-12 NOTE — Plan of Care (Signed)
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
## 2022-07-13 DIAGNOSIS — Z5900 Homelessness unspecified: Secondary | ICD-10-CM

## 2022-07-13 DIAGNOSIS — F322 Major depressive disorder, single episode, severe without psychotic features: Secondary | ICD-10-CM

## 2022-07-13 DIAGNOSIS — D75839 Thrombocytosis, unspecified: Secondary | ICD-10-CM

## 2022-07-13 LAB — CULTURE, BLOOD (ROUTINE X 2)
Culture: NO GROWTH
Culture: NO GROWTH
Special Requests: ADEQUATE
Special Requests: ADEQUATE

## 2022-07-13 LAB — CBC WITH DIFFERENTIAL/PLATELET
Abs Immature Granulocytes: 0.04 10*3/uL (ref 0.00–0.07)
Basophils Absolute: 0.1 10*3/uL (ref 0.0–0.1)
Basophils Relative: 1 %
Eosinophils Absolute: 0.1 10*3/uL (ref 0.0–0.5)
Eosinophils Relative: 1 %
HCT: 35.9 % — ABNORMAL LOW (ref 39.0–52.0)
Hemoglobin: 11.3 g/dL — ABNORMAL LOW (ref 13.0–17.0)
Immature Granulocytes: 0 %
Lymphocytes Relative: 30 %
Lymphs Abs: 2.9 10*3/uL (ref 0.7–4.0)
MCH: 28.8 pg (ref 26.0–34.0)
MCHC: 31.5 g/dL (ref 30.0–36.0)
MCV: 91.6 fL (ref 80.0–100.0)
Monocytes Absolute: 0.7 10*3/uL (ref 0.1–1.0)
Monocytes Relative: 7 %
Neutro Abs: 5.9 10*3/uL (ref 1.7–7.7)
Neutrophils Relative %: 61 %
Platelets: 896 10*3/uL — ABNORMAL HIGH (ref 150–400)
RBC: 3.92 MIL/uL — ABNORMAL LOW (ref 4.22–5.81)
RDW: 14.1 % (ref 11.5–15.5)
WBC: 9.8 10*3/uL (ref 4.0–10.5)
nRBC: 0 % (ref 0.0–0.2)

## 2022-07-13 MED ORDER — ENOXAPARIN SODIUM 40 MG/0.4ML IJ SOSY
40.0000 mg | PREFILLED_SYRINGE | INTRAMUSCULAR | Status: DC
  Filled 2022-07-13 (×9): qty 0.4

## 2022-07-13 MED ORDER — VANCOMYCIN HCL 500 MG/100ML IV SOLN
500.0000 mg | Freq: Three times a day (TID) | INTRAVENOUS | Status: DC
  Administered 2022-07-13: 500 mg via INTRAVENOUS
  Filled 2022-07-13 (×3): qty 100

## 2022-07-13 MED ORDER — VANCOMYCIN HCL 1.25 G IV SOLR
1250.0000 mg | Freq: Three times a day (TID) | INTRAVENOUS | Status: DC
  Administered 2022-07-13 (×2): 1250 mg via INTRAVENOUS
  Filled 2022-07-13 (×4): qty 25

## 2022-07-13 MED ORDER — VANCOMYCIN HCL 750 MG/150ML IV SOLN
750.0000 mg | Freq: Three times a day (TID) | INTRAVENOUS | Status: DC
  Administered 2022-07-13: 750 mg via INTRAVENOUS
  Filled 2022-07-13 (×3): qty 150
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
## 2022-07-13 NOTE — TOC Progression Note (Signed)
Transition of Care [facility]) - Progression Note    Patient Details  Name: David Bean MRN: [id number] Date of Birth: 01/15/1976  Transition of Care [facility]) CM/SW [place], RN Phone Number: 07/13/2022, 9:28 AM  Clinical Narrative:     Late Note Spoke with the patient's exwife when she called, I explained to her that the patient was not able to go to STR, he has no insurance to cover it and it would be out of pocket Her mother then called ma and I explained that if the patient wanted to go to Drug rehab he would have to sign himself up for that, she stated that she knew  but was hoping, I explained that he is currently on IV ABX and will remain in the hospital until that is completed, she stated understanding  Expected Discharge Plan: Homeless Shelter Barriers to Discharge: Homeless with medical needs  Expected Discharge Plan and Services   Discharge Planning Services: CM Consult   Living arrangements for the past 2 months: Homeless                                       Social Determinants of Health (SDOH) Interventions SDOH Screenings   Food Insecurity: No Food Insecurity (07/03/2022)  Housing: Low Risk  (07/03/2022)  Transportation Needs: Unmet Transportation Needs (07/08/2022)  Utilities: Not At Risk (07/03/2022)  Tobacco Use: High Risk (07/11/2022)    Readmission Risk Interventions     No data to display
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
## 2022-07-14 LAB — CBC
HCT: 36.3 % — ABNORMAL LOW (ref 39.0–52.0)
Hemoglobin: 11.3 g/dL — ABNORMAL LOW (ref 13.0–17.0)
MCH: 28.7 pg (ref 26.0–34.0)
MCHC: 31.1 g/dL (ref 30.0–36.0)
MCV: 92.1 fL (ref 80.0–100.0)
Platelets: 815 10*3/uL — ABNORMAL HIGH (ref 150–400)
RBC: 3.94 MIL/uL — ABNORMAL LOW (ref 4.22–5.81)
RDW: 13.9 % (ref 11.5–15.5)
WBC: 7.8 10*3/uL (ref 4.0–10.5)
nRBC: 0 % (ref 0.0–0.2)

## 2022-07-14 LAB — BASIC METABOLIC PANEL
Anion gap: 11 (ref 5–15)
BUN: 15 mg/dL (ref 6–20)
CO2: 22 mmol/L (ref 22–32)
Calcium: 8.3 mg/dL — ABNORMAL LOW (ref 8.9–10.3)
Chloride: 104 mmol/L (ref 98–111)
Creatinine, Ser: 0.55 mg/dL — ABNORMAL LOW (ref 0.61–1.24)
GFR, Estimated: >60 mL/min (ref >60)
Glucose, Bld: 144 mg/dL — ABNORMAL HIGH (ref 70–99)
Potassium: 3.8 mmol/L (ref 3.5–5.1)
Sodium: 137 mmol/L (ref 135–145)

## 2022-07-14 MED ORDER — VANCOMYCIN HCL 1250 MG/250ML IV SOLN
1250.0000 mg | Freq: Three times a day (TID) | INTRAVENOUS | Status: DC
  Administered 2022-07-14 – 2022-08-06 (×70): 1250 mg via INTRAVENOUS
  Filled 2022-07-14 (×72): qty 250

## 2022-07-14 MED ORDER — AMOXICILLIN 500 MG PO CAPS
500.0000 mg | ORAL_CAPSULE | Freq: Three times a day (TID) | ORAL | Status: AC
  Administered 2022-07-14 – 2022-07-19 (×14): 500 mg via ORAL
  Filled 2022-07-14 (×15): qty 1
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
## 2022-07-14 NOTE — TOC Progression Note (Signed)
Transition of Care [facility]) - Progression Note    Patient Details  Name: David Bean MRN: [id number] Date of Birth: [birth year]  Transition of Care [facility]) CM/SW [place], RN Phone Number: 07/14/2022, 11:31 AM  Clinical Narrative:   Patient remains in the hospital for IV ABX, TOC will continue to follow for needs    Expected Discharge Plan: Homeless Shelter Barriers to Discharge: Homeless with medical needs  Expected Discharge Plan and Services   Discharge Planning Services: CM Consult   Living arrangements for the past 2 months: Homeless                                       Social Determinants of Health (SDOH) Interventions SDOH Screenings   Food Insecurity: No Food Insecurity (07/03/2022)  Housing: Low Risk  (07/03/2022)  Transportation Needs: Unmet Transportation Needs (07/08/2022)  Utilities: Not At Risk (07/03/2022)  Tobacco Use: High Risk (07/11/2022)    Readmission Risk Interventions     No data to display

## 2022-07-14 NOTE — Plan of Care (Signed)
  Problem: Education: Goal: Knowledge of General Education information will improve Description Including pain rating scale, medication(s)/side effects and non-pharmacologic comfort measures Outcome: Progressing   Problem: Clinical Measurements: Goal: Ability to maintain clinical measurements within normal limits will improve Outcome: Progressing   Problem: Activity: Goal: Risk for activity intolerance will decrease Outcome: Progressing   

## 2022-07-14 NOTE — Plan of Care (Signed)

## 2022-07-15 LAB — BCR-ABL1 FISH
Cells Analyzed: 200
Cells Counted: 200
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
## 2022-07-15 NOTE — Plan of Care (Signed)
  Problem: Coping: Goal: Level of anxiety will decrease Outcome: Progressing   Problem: Pain Managment: Goal: General experience of comfort will improve Outcome: Progressing   Problem: Safety: Goal: Ability to remain free from injury will improve Outcome: Progressing   

## 2022-07-16 LAB — VANCOMYCIN, TROUGH: Vancomycin Tr: 15 ug/mL (ref 15–20)

## 2022-07-16 LAB — CREATININE, SERUM
Creatinine, Ser: 0.57 mg/dL — ABNORMAL LOW (ref 0.61–1.24)
GFR, Estimated: >60 mL/min (ref >60)

## 2022-07-16 MED ORDER — ACETAMINOPHEN 500 MG PO TABS
1000.0000 mg | ORAL_TABLET | Freq: Four times a day (QID) | ORAL | Status: DC | PRN
  Administered 2022-07-18 – 2022-08-06 (×24): 1000 mg via ORAL
  Filled 2022-07-16 (×25): qty 2

## 2022-07-16 MED ORDER — OXYCODONE HCL 5 MG PO TABS
2.5000 mg | ORAL_TABLET | Freq: Four times a day (QID) | ORAL | Status: DC | PRN
  Administered 2022-07-16 – 2022-07-20 (×12): 2.5 mg via ORAL
  Filled 2022-07-16 (×14): qty 1
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
## 2022-07-16 NOTE — Plan of Care (Signed)
  Problem: Education: Goal: Knowledge of General Education information will improve Description: Including pain rating scale, medication(s)/side effects and non-pharmacologic comfort measures Outcome: Progressing   Problem: Clinical Measurements: Goal: Ability to maintain clinical measurements within normal limits will improve Outcome: Progressing Goal: Will remain free from infection Outcome: Progressing Goal: Diagnostic test results will improve Outcome: Progressing Goal: Respiratory complications will improve Outcome: Progressing Goal: Cardiovascular complication will be avoided Outcome: Progressing   Problem: Activity: Goal: Risk for activity intolerance will decrease Outcome: Progressing   Problem: Coping: Goal: Level of anxiety will decrease Outcome: Progressing   Problem: Elimination: Goal: Will not experience complications related to bowel motility Outcome: Progressing Goal: Will not experience complications related to urinary retention Outcome: Progressing   Problem: Pain Managment: Goal: General experience of comfort will improve Outcome: Progressing   Problem: Safety: Goal: Ability to remain free from injury will improve Outcome: Progressing   Problem: Skin Integrity: Goal: Risk for impaired skin integrity will decrease Outcome: Progressing   

## 2022-07-16 NOTE — Progress Notes (Signed)
Pt refused to take his po antibiotics this morning. He was irritated because the nurse wake him up at 645 to give him his medication. Pt was irritated, non-compliant, and disrespectful

## 2022-07-16 NOTE — TOC Progression Note (Signed)
Transition of Care [facility]) - Progression Note    Patient Details  Name: David Bean MRN: [id number] Date of Birth: 07/23/1975  Transition of Care [facility]) CM/SW [place], RN Phone Number: 07/16/2022, 9:39 AM  Clinical Narrative:     The patient continues IV ABX, he refused his PO ABX this morning per nurse, TOC to continue to follow for needs and DC planning  Expected Discharge Plan: Homeless Shelter Barriers to Discharge: Homeless with medical needs  Expected Discharge Plan and Services   Discharge Planning Services: CM Consult   Living arrangements for the past 2 months: Homeless                                       Social Determinants of Health (SDOH) Interventions SDOH Screenings   Food Insecurity: No Food Insecurity (07/03/2022)  Housing: Low Risk  (07/03/2022)  Transportation Needs: Unmet Transportation Needs (07/08/2022)  Utilities: Not At Risk (07/03/2022)  Tobacco Use: High Risk (07/11/2022)    Readmission Risk Interventions     No data to display

## 2022-07-16 NOTE — Plan of Care (Signed)
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
## 2022-07-17 LAB — CBC
HCT: 37.5 % — ABNORMAL LOW (ref 39.0–52.0)
Hemoglobin: 12.1 g/dL — ABNORMAL LOW (ref 13.0–17.0)
MCH: 29.3 pg (ref 26.0–34.0)
MCHC: 32.3 g/dL (ref 30.0–36.0)
MCV: 90.8 fL (ref 80.0–100.0)
Platelets: 660 10*3/uL — ABNORMAL HIGH (ref 150–400)
RBC: 4.13 MIL/uL — ABNORMAL LOW (ref 4.22–5.81)
RDW: 14.3 % (ref 11.5–15.5)
WBC: 7 10*3/uL (ref 4.0–10.5)
nRBC: 0 % (ref 0.0–0.2)

## 2022-07-17 NOTE — Plan of Care (Signed)

## 2022-07-17 NOTE — Plan of Care (Signed)
  Problem: Education: Goal: Knowledge of General Education information will improve Description: Including pain rating scale, medication(s)/side effects and non-pharmacologic comfort measures Outcome: Not Progressing   Problem: Health Behavior/Discharge Planning: Goal: Ability to manage health-related needs will improve Outcome: Not Progressing   Problem: Pain Managment: Goal: General experience of comfort will improve Outcome: Not Progressing   Problem: Clinical Measurements: Goal: Ability to maintain clinical measurements within normal limits will improve Outcome: Progressing Goal: Will remain free from infection Outcome: Progressing Goal: Diagnostic test results will improve Outcome: Progressing Goal: Respiratory complications will improve Outcome: Progressing Goal: Cardiovascular complication will be avoided Outcome: Progressing   Problem: Activity: Goal: Risk for activity intolerance will decrease Outcome: Progressing   Problem: Nutrition: Goal: Adequate nutrition will be maintained Outcome: Progressing   Problem: Coping: Goal: Level of anxiety will decrease Outcome: Progressing   Problem: Elimination: Goal: Will not experience complications related to bowel motility Outcome: Progressing Goal: Will not experience complications related to urinary retention Outcome: Progressing   Problem: Safety: Goal: Ability to remain free from injury will improve Outcome: Progressing   Problem: Skin Integrity: Goal: Risk for impaired skin integrity will decrease Outcome: Progressing
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
## 2022-07-18 LAB — CBC
HCT: 37.1 % — ABNORMAL LOW (ref 39.0–52.0)
Hemoglobin: 12.1 g/dL — ABNORMAL LOW (ref 13.0–17.0)
MCH: 29.7 pg (ref 26.0–34.0)
MCHC: 32.6 g/dL (ref 30.0–36.0)
MCV: 90.9 fL (ref 80.0–100.0)
Platelets: 616 10*3/uL — ABNORMAL HIGH (ref 150–400)
RBC: 4.08 MIL/uL — ABNORMAL LOW (ref 4.22–5.81)
RDW: 14.4 % (ref 11.5–15.5)
WBC: 8.9 10*3/uL (ref 4.0–10.5)
nRBC: 0 % (ref 0.0–0.2)

## 2022-07-18 NOTE — Plan of Care (Signed)
  Problem: Education: Goal: Knowledge of General Education information will improve Description: Including pain rating scale, medication(s)/side effects and non-pharmacologic comfort measures 07/18/2022 2320 by Francis Dowse, RN Outcome: Progressing 07/18/2022 1948 by Francis Dowse, RN Outcome: Progressing   Problem: Health Behavior/Discharge Planning: Goal: Ability to manage health-related needs will improve 07/18/2022 2320 by Francis Dowse, RN Outcome: Progressing 07/18/2022 1948 by Francis Dowse, RN Outcome: Progressing   Problem: Clinical Measurements: Goal: Ability to maintain clinical measurements within normal limits will improve 07/18/2022 2320 by Francis Dowse, RN Outcome: Progressing 07/18/2022 1948 by Francis Dowse, RN Outcome: Progressing Goal: Will remain free from infection 07/18/2022 2320 by Francis Dowse, RN Outcome: Progressing 07/18/2022 1948 by Francis Dowse, RN Outcome: Progressing Goal: Diagnostic test results will improve 07/18/2022 2320 by Francis Dowse, RN Outcome: Progressing 07/18/2022 1948 by Francis Dowse, RN Outcome: Progressing Goal: Respiratory complications will improve 07/18/2022 2320 by Francis Dowse, RN Outcome: Progressing 07/18/2022 1948 by Francis Dowse, RN Outcome: Progressing Goal: Cardiovascular complication will be avoided 07/18/2022 2320 by Francis Dowse, RN Outcome: Progressing 07/18/2022 1948 by Francis Dowse, RN Outcome: Progressing   Problem: Activity: Goal: Risk for activity intolerance will decrease 07/18/2022 2320 by Francis Dowse, RN Outcome: Progressing 07/18/2022 1948 by Francis Dowse, RN Outcome: Progressing   Problem: Nutrition: Goal: Adequate nutrition will be maintained 07/18/2022 2320 by Francis Dowse, RN Outcome: Progressing 07/18/2022 1948 by Francis Dowse, RN Outcome:  Progressing   Problem: Coping: Goal: Level of anxiety will decrease 07/18/2022 2320 by Francis Dowse, RN Outcome: Progressing 07/18/2022 1948 by Francis Dowse, RN Outcome: Progressing   Problem: Elimination: Goal: Will not experience complications related to bowel motility 07/18/2022 2320 by Francis Dowse, RN Outcome: Progressing 07/18/2022 1948 by Francis Dowse, RN Outcome: Progressing Goal: Will not experience complications related to urinary retention 07/18/2022 2320 by Francis Dowse, RN Outcome: Progressing 07/18/2022 1948 by Francis Dowse, RN Outcome: Progressing   Problem: Pain Managment: Goal: General experience of comfort will improve 07/18/2022 2320 by Francis Dowse, RN Outcome: Progressing 07/18/2022 1948 by Francis Dowse, RN Outcome: Progressing   Problem: Safety: Goal: Ability to remain free from injury will improve 07/18/2022 2320 by Francis Dowse, RN Outcome: Progressing 07/18/2022 1948 by Francis Dowse, RN Outcome: Progressing   Problem: Skin Integrity: Goal: Risk for impaired skin integrity will decrease 07/18/2022 2320 by Francis Dowse, RN Outcome: Progressing 07/18/2022 1948 by Francis Dowse, RN Outcome: Progressing
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
## 2022-07-18 NOTE — Plan of Care (Signed)

## 2022-07-19 ENCOUNTER — Inpatient Hospital Stay: Payer: Self-pay

## 2022-07-19 DIAGNOSIS — M4646 Discitis, unspecified, lumbar region: Secondary | ICD-10-CM

## 2022-07-19 LAB — CREATININE, SERUM
Creatinine, Ser: 0.54 mg/dL — ABNORMAL LOW (ref 0.61–1.24)
GFR, Estimated: >60 mL/min (ref >60)

## 2022-07-19 MED ORDER — GADOBUTROL 1 MMOL/ML IV SOLN
7.0000 mL | Freq: Once | INTRAVENOUS | Status: AC | PRN
  Administered 2022-07-19: 7 mL via INTRAVENOUS

## 2022-07-19 NOTE — TOC Progression Note (Signed)
Transition of Care [facility]) - Progression Note    Patient Details  Name: David Bean MRN: [id number] Date of Birth: 08/21/1975  Transition of Care [facility]) CM/SW [place], RN Phone Number: 07/19/2022, 9:38 AM  Clinical Narrative:     TOC continues to follow the patient  Remains on IV ABX, unable to DC with a PICC line  Expected Discharge Plan: Homeless Shelter Barriers to Discharge: Homeless with medical needs  Expected Discharge Plan and Services   Discharge Planning Services: CM Consult   Living arrangements for the past 2 months: Homeless                                       Social Determinants of Health ([place]) Interventions SDOH Screenings   Food Insecurity: No Food Insecurity (07/03/2022)  Housing: Low Risk  (07/03/2022)  Transportation Needs: Unmet Transportation Needs (07/08/2022)  Utilities: Not At Risk (07/03/2022)  Tobacco Use: High Risk (07/11/2022)    Readmission Risk Interventions     No data to display
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
## 2022-07-20 DIAGNOSIS — M4626 Osteomyelitis of vertebra, lumbar region: Secondary | ICD-10-CM | POA: Insufficient documentation

## 2022-07-20 LAB — C-REACTIVE PROTEIN: CRP: 2 mg/dL — ABNORMAL HIGH (ref <1.0)

## 2022-07-20 LAB — CREATININE, SERUM
Creatinine, Ser: 0.57 mg/dL — ABNORMAL LOW (ref 0.61–1.24)
GFR, Estimated: >60 mL/min (ref >60)

## 2022-07-20 LAB — JAK2 V617F RFX CALR/MPL/E12-15

## 2022-07-20 LAB — SEDIMENTATION RATE: Sed Rate: 66 mm/hr — ABNORMAL HIGH (ref 0–15)

## 2022-07-20 LAB — CALR +MPL + E12-E15  (REFLEX)

## 2022-07-20 MED ORDER — OXYCODONE HCL 5 MG PO TABS
2.5000 mg | ORAL_TABLET | Freq: Two times a day (BID) | ORAL | Status: DC | PRN
  Administered 2022-07-20 – 2022-07-21 (×2): 2.5 mg via ORAL
  Filled 2022-07-20 (×2): qty 1
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
## 2022-07-20 NOTE — TOC Progression Note (Signed)
Transition of Care [facility]) - Progression Note    Patient Details  Name: AEDAN GEIMER MRN: [id number] Date of Birth: 1976/04/22  Transition of Care [facility]) CM/SW [place], RN Phone Number: 07/20/2022, 9:59 AM  Clinical Narrative:    TOC continues to follow and assist    Expected Discharge Plan: Homeless Shelter Barriers to Discharge: Homeless with medical needs  Expected Discharge Plan and Services   Discharge Planning Services: CM Consult   Living arrangements for the past 2 months: Homeless                                       Social Determinants of Health (SDOH) Interventions SDOH Screenings   Food Insecurity: No Food Insecurity (07/03/2022)  Housing: Low Risk  (07/03/2022)  Transportation Needs: Unmet Transportation Needs (07/08/2022)  Utilities: Not At Risk (07/03/2022)  Tobacco Use: High Risk (07/11/2022)    Readmission Risk Interventions     No data to display

## 2022-07-21 MED ORDER — OXYCODONE HCL 5 MG PO TABS
2.5000 mg | ORAL_TABLET | Freq: Three times a day (TID) | ORAL | Status: DC | PRN
  Administered 2022-07-21 – 2022-08-06 (×35): 2.5 mg via ORAL
  Filled 2022-07-21 (×38): qty 1

## 2022-07-21 MED ORDER — OXYCODONE HCL 5 MG PO TABS: 2.5000 mg | ORAL_TABLET | Freq: Four times a day (QID) | ORAL | Status: DC | PRN

## 2022-07-21 NOTE — TOC Progression Note (Signed)
Transition of Care [facility]) - Progression Note    Patient Details  Name: David Bean MRN: [id number] Date of Birth: 03/25/1976  Transition of Care [facility]) CM/SW [place], RN Phone Number: 07/21/2022, 11:49 AM  Clinical Narrative:    TOC continues to follow the patient and assist with DC planning Abx started 12/24 Due to IV drug abuse, homelessness, patient will have to finish 6 weeks of IV antibiotic in the hospital,    Expected Discharge Plan: Homeless Shelter Barriers to Discharge: Homeless with medical needs  Expected Discharge Plan and Services   Discharge Planning Services: CM Consult   Living arrangements for the past 2 months: Homeless                                       Social Determinants of Health (SDOH) Interventions SDOH Screenings   Food Insecurity: No Food Insecurity (07/03/2022)  Housing: [place]  (07/03/2022)  Transportation Needs: Unmet Transportation Needs (07/08/2022)  Utilities: Not At Risk (07/03/2022)  Tobacco Use: High Risk (07/11/2022)    Readmission Risk Interventions     No data to display
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
## 2022-07-22 LAB — CREATININE, SERUM
Creatinine, Ser: 0.68 mg/dL (ref 0.61–1.24)
GFR, Estimated: >60 mL/min (ref >60)

## 2022-07-22 MED ORDER — LIDOCAINE 5 % EX PTCH
1.0000 | MEDICATED_PATCH | CUTANEOUS | Status: DC
  Administered 2022-07-22 – 2022-08-06 (×16): 1 via TRANSDERMAL
  Filled 2022-07-22 (×16): qty 1
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
## 2022-07-23 DIAGNOSIS — M4626 Osteomyelitis of vertebra, lumbar region: Secondary | ICD-10-CM

## 2022-07-23 LAB — CREATININE, SERUM
Creatinine, Ser: 0.59 mg/dL — ABNORMAL LOW (ref 0.61–1.24)
GFR, Estimated: >60 mL/min (ref >60)

## 2022-07-23 LAB — VANCOMYCIN, TROUGH: Vancomycin Tr: 15 ug/mL (ref 15–20)
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
## 2022-07-23 NOTE — Plan of Care (Signed)
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
## 2022-07-26 LAB — CREATININE, SERUM
Creatinine, Ser: 0.68 mg/dL (ref 0.61–1.24)
GFR, Estimated: >60 mL/min (ref >60)
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
## 2022-07-26 NOTE — Progress Notes (Signed)
Pt refused lovenox this am. He was educated on why he needed the medication and he could develop blood clots without it. He chose not to have it

## 2022-07-26 NOTE — TOC Progression Note (Signed)
Transition of Care [facility]) - Progression Note    Patient Details  Name: David Bean MRN: [id number] Date of Birth: February 03, 1976  Transition of Care [facility]) CM/SW [place], RN Phone Number: 07/26/2022, 1:37 PM  Clinical Narrative:    TOC continues following for needs   Expected Discharge Plan: Homeless Shelter Barriers to Discharge: Homeless with medical needs  Expected Discharge Plan and Services   Discharge Planning Services: CM Consult   Living arrangements for the past 2 months: Homeless                                       Social Determinants of Health (SDOH) Interventions SDOH Screenings   Food Insecurity: No Food Insecurity (07/03/2022)  Housing: Low Risk  (07/03/2022)  Transportation Needs: Unmet Transportation Needs (07/08/2022)  Utilities: Not At Risk (07/03/2022)  Tobacco Use: High Risk (07/11/2022)    Readmission Risk Interventions     No data to display

## 2022-07-26 NOTE — Plan of Care (Signed)
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
## 2022-07-27 LAB — COMPREHENSIVE METABOLIC PANEL
ALT: 22 U/L (ref 0–44)
AST: 17 U/L (ref 15–41)
Albumin: 3.2 g/dL — ABNORMAL LOW (ref 3.5–5.0)
Alkaline Phosphatase: 134 U/L — ABNORMAL HIGH (ref 38–126)
Anion gap: 8 (ref 5–15)
BUN: 14 mg/dL (ref 6–20)
CO2: 25 mmol/L (ref 22–32)
Calcium: 8.9 mg/dL (ref 8.9–10.3)
Chloride: 102 mmol/L (ref 98–111)
Creatinine, Ser: 0.66 mg/dL (ref 0.61–1.24)
GFR, Estimated: >60 mL/min (ref >60)
Glucose, Bld: 89 mg/dL (ref 70–99)
Potassium: 4.3 mmol/L (ref 3.5–5.1)
Sodium: 135 mmol/L (ref 135–145)
Total Bilirubin: 0.6 mg/dL (ref 0.3–1.2)
Total Protein: 7.7 g/dL (ref 6.5–8.1)

## 2022-07-27 LAB — CBC WITH DIFFERENTIAL/PLATELET
Abs Immature Granulocytes: 0.01 10*3/uL (ref 0.00–0.07)
Basophils Absolute: 0.1 10*3/uL (ref 0.0–0.1)
Basophils Relative: 1 %
Eosinophils Absolute: 0.2 10*3/uL (ref 0.0–0.5)
Eosinophils Relative: 3 %
HCT: 38.2 % — ABNORMAL LOW (ref 39.0–52.0)
Hemoglobin: 12.3 g/dL — ABNORMAL LOW (ref 13.0–17.0)
Immature Granulocytes: 0 %
Lymphocytes Relative: 40 %
Lymphs Abs: 3 10*3/uL (ref 0.7–4.0)
MCH: 28.9 pg (ref 26.0–34.0)
MCHC: 32.2 g/dL (ref 30.0–36.0)
MCV: 89.9 fL (ref 80.0–100.0)
Monocytes Absolute: 0.6 10*3/uL (ref 0.1–1.0)
Monocytes Relative: 9 %
Neutro Abs: 3.6 10*3/uL (ref 1.7–7.7)
Neutrophils Relative %: 47 %
Platelets: 305 10*3/uL (ref 150–400)
RBC: 4.25 MIL/uL (ref 4.22–5.81)
RDW: 14.1 % (ref 11.5–15.5)
WBC: 7.5 10*3/uL (ref 4.0–10.5)
nRBC: 0 % (ref 0.0–0.2)

## 2022-07-27 LAB — C-REACTIVE PROTEIN: CRP: 1.3 mg/dL — ABNORMAL HIGH (ref <1.0)

## 2022-07-27 LAB — SEDIMENTATION RATE: Sed Rate: 52 mm/hr — ABNORMAL HIGH (ref 0–15)

## 2022-07-27 NOTE — TOC Progression Note (Signed)
Transition of Care [facility]) - Progression Note    Patient Details  Name: David Bean MRN: [id number] Date of Birth: Aug 11, 1975  [facility]) CM/SW [place], RN Phone Number: 07/27/2022, 11:18 AM  Clinical Narrative:   TOC continues following for needs     Expected Discharge Plan: Homeless Shelter Barriers to Discharge: Homeless with medical needs  Expected Discharge Plan and Services   Discharge Planning Services: CM Consult   Living arrangements for the past 2 months: Homeless                                       Social Determinants of Health (SDOH) Interventions SDOH Screenings   Food Insecurity: No Food Insecurity (07/03/2022)  Housing: Low Risk  (07/03/2022)  Transportation Needs: Unmet Transportation Needs (07/08/2022)  Utilities: Not At Risk (07/03/2022)  Tobacco Use: High Risk (07/11/2022)    Readmission Risk Interventions     No data to display
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
## 2022-07-27 NOTE — Plan of Care (Addendum)
Patient refused vital signs during this shift.  Problem: Education: Goal: Knowledge of General Education information will improve Description: Including pain rating scale, medication(s)/side effects and non-pharmacologic comfort measures Outcome: Progressing   Problem: Health Behavior/Discharge Planning: Goal: Ability to manage health-related needs will improve Outcome: Progressing   Problem: Clinical Measurements: Goal: Ability to maintain clinical measurements within normal limits will improve Outcome: Progressing Goal: Will remain free from infection Outcome: Progressing Goal: Diagnostic test results will improve Outcome: Progressing Goal: Respiratory complications will improve Outcome: Progressing Goal: Cardiovascular complication will be avoided Outcome: Progressing   Problem: Activity: Goal: Risk for activity intolerance will decrease Outcome: Progressing   Problem: Nutrition: Goal: Adequate nutrition will be maintained Outcome: Progressing   Problem: Coping: Goal: Level of anxiety will decrease Outcome: Progressing   Problem: Elimination: Goal: Will not experience complications related to bowel motility Outcome: Progressing Goal: Will not experience complications related to urinary retention Outcome: Progressing   Problem: Pain Managment: Goal: General experience of comfort will improve Outcome: Progressing   Problem: Safety: Goal: Ability to remain free from injury will improve Outcome: Progressing   Problem: Skin Integrity: Goal: Risk for impaired skin integrity will decrease Outcome: Progressing
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
## 2022-07-27 NOTE — TOC Progression Note (Addendum)
Transition of Care [facility]) - Progression Note    Patient Details  Name: David Bean MRN: [id number] Date of Birth: [birth year]  Transition of Care [facility]) CM/SW [place], RN Phone Number: 07/27/2022, 2:08 PM  Clinical Narrative:    Spoke with the patient's ex wife Angie, she stated that she had planned to pick up the patient and take him to her mothers but her family said no way, she stated that he is homeless, he has criminal charges at this time, she stated that she has not spoke to his attorney but she spoke with hers, the patient was scheduled to go to a program and he did not do the actions that he was supposed to, his attorney will pay for an assessment to have done after he gets out of the hospital to hopefully get into a program, however, he is likely to have to do jail time due to the charges, she stated that depending on who his judge is he may be able to go to the program that was originally planned to go to if he will do the steps needed.  She stated that sending him to a shelter is not probably going to happen She stated that the patient will not likely agree to go to a shelter I explained to her that we will present the patient with resources and it would be his choice to use them or not [place] that [place], he will not have transportation to come weekly to get IV ABX, and will likely refuse even if someone gave him a ride, she asked that we call her when he discharges so that she can get him to the assessment I explained we would call her unless he tells [place] we can not, she stated to not use that as an excuse, I explained that at any time the patient can tell us not to call anyone he wished and that by HIPAA law we would not be able to but unless he asked [place] not because he has already provided the permission and signed the HIPAA forms including her  to be allowed to have information then we would be happy to call her. I explained that we were not  discharging today that we were trying to formulate a plan and determine his options, she stated that he has no options, She stated that he will refuse to do what he needs to do, I explained to her that we can not force him to do anything he has the right to make his choices, she stated understanding       Expected Discharge Plan: Homeless Shelter Barriers to Discharge: Homeless with medical needs  Expected Discharge Plan and Services   Discharge Planning Services: CM Consult   Living arrangements for the past 2 months: Homeless                                       Social Determinants of Health (SDOH) Interventions SDOH Screenings   Food Insecurity: No Food Insecurity (07/03/2022)  Housing: Low Risk  (07/03/2022)  Transportation Needs: Unmet Transportation Needs (07/08/2022)  Utilities: Not At Risk (07/03/2022)  Tobacco Use: High Risk (07/11/2022)    Readmission Risk Interventions     No data to display
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
## 2022-07-28 DIAGNOSIS — E278 Other specified disorders of adrenal gland: Secondary | ICD-10-CM
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
## 2022-07-28 NOTE — Plan of Care (Signed)
  Problem: Clinical Measurements: Goal: Ability to maintain clinical measurements within normal limits will improve Outcome: Progressing Goal: Will remain free from infection Outcome: Progressing   Problem: Pain Managment: Goal: General experience of comfort will improve Outcome: Progressing
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
## 2022-07-29 NOTE — Plan of Care (Signed)
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
## 2022-07-30 LAB — CREATININE, SERUM
Creatinine, Ser: 0.66 mg/dL (ref 0.61–1.24)
GFR, Estimated: >60 mL/min (ref >60)

## 2022-07-30 LAB — VANCOMYCIN, TROUGH: Vancomycin Tr: 11 ug/mL — ABNORMAL LOW (ref 15–20)

## 2022-07-30 NOTE — Plan of Care (Signed)

## 2022-07-30 NOTE — Consult Note (Signed)
Pharmacy Antibiotic Note  David Bean is a 47 y.o. male admitted on 07/02/2022 with MRSA bacteremia and acute back pain with L2 vertebral fracture. PMH includes IV amphetamine abuse. Pharmacy has been consulted for vancomycin dosing.  Today, 07/30/2022 Day #27 vancomycin (Day #23 since first negative blood cx) Renal: SCr stable Afebrile Repeat Blood cx 12/28 NG TEE 12/29 neg for vegetations ID following  Levels 1/19 vancomycin dose 1250 mg IV q8h (last dose 0551 on 1/19), vancomycin trough 11 at 1329 on 1/19.  Of note patient refused dose of Vancomycin 1/18 @ 2310 per nursing documentation. 1/12 vancomcyin dose 1250mg  IV q8h (dose at 21:09 on 1/11), vancomycin trough 15 mcg/mL at 04:37 1/12 1/5  vancomycin 1250mg  IV q8h - given at 0639 with Vancomycin trough 15 mcg/ml at 1339   Previous vancomycin levels 12/31: Dose vancomycin 1250mg  IV q8h - dose at 0619 Vancomycin peak 30 mcg/ml at 08:47 Vancomycin trough 16 mcg/mL at 12:46    Plan: Continue vancomycin 1250mg  IV q8h AUC 539 (goal 400-600) Reorder vancomycin trough for 1/20 @ 1330 due to missed dose   Height: 5\' 9"  (175.3 cm) Weight: 72.6 kg (160 lb) IBW/kg (Calculated) : 70.7  Temp (24hrs), Avg:97.9 F (36.6 C), Min:97.8 F (36.6 C), Max:98 F (36.7 C)  Recent Labs  Lab 07/26/22 0517 07/27/22 0646 07/30/22 1329  WBC  --  7.5  --   CREATININE 0.68 0.66 0.66     Estimated Creatinine Clearance: 114.2 mL/min (by C-G formula based on SCr of 0.66 mg/dL).    Allergies  Allergen Reactions   Aspirin Anaphylaxis    Antimicrobials this admission: 12/24 Vancomycin >>   Dose adjustments this admission: 12/28  Vanc adj from 1250 q12h to q8h  Microbiology results: 12/23 BCx: 2/4 GPC: MRSA (MIC < 0.15) 12/25 Bcx: MRSA 12/28 Bcx: NGTD 12/27 Sputum: few:  FEW HAEMOPHILUS INFLUENZAE  BETA LACTAMASE NEGATIVE   Thank you for allowing pharmacy to be a part of this patient's care.  Beatris Si, PharmD Clinical  Pharmacist 07/30/2022 1:51 PM
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
## 2022-07-30 NOTE — TOC Progression Note (Signed)
Transition of Care [facility]) - Progression Note    Patient Details  Name: David Bean MRN: [id number] Date of Birth: 1975-08-30  Transition of Care [facility]) CM/SW [place], RN Phone Number: 07/30/2022, 10:22 AM  Clinical Narrative:     TOC continues to follow for needs  Expected Discharge Plan: Homeless Shelter Barriers to Discharge: Homeless with medical needs  Expected Discharge Plan and Services   Discharge Planning Services: CM Consult   Living arrangements for the past 2 months: Homeless                                       Social Determinants of Health (SDOH) Interventions SDOH Screenings   Food Insecurity: No Food Insecurity (07/03/2022)  Housing: [place]  (07/03/2022)  Transportation Needs: Unmet Transportation Needs (07/08/2022)  Utilities: Not At Risk (07/03/2022)  Tobacco Use: High Risk (07/11/2022)    Readmission Risk Interventions     No data to display
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
## 2022-07-30 NOTE — Plan of Care (Signed)
Patient refused HS meds. Was educated on importance of Abx treatment.  Problem: Education: Goal: Knowledge of General Education information will improve Description: Including pain rating scale, medication(s)/side effects and non-pharmacologic comfort measures Outcome: Progressing   Problem: Health Behavior/Discharge Planning: Goal: Ability to manage health-related needs will improve Outcome: Progressing   Problem: Clinical Measurements: Goal: Ability to maintain clinical measurements within normal limits will improve Outcome: Progressing Goal: Will remain free from infection Outcome: Progressing Goal: Diagnostic test results will improve Outcome: Progressing Goal: Respiratory complications will improve Outcome: Progressing Goal: Cardiovascular complication will be avoided Outcome: Progressing   Problem: Activity: Goal: Risk for activity intolerance will decrease Outcome: Progressing   Problem: Nutrition: Goal: Adequate nutrition will be maintained Outcome: Progressing   Problem: Coping: Goal: Level of anxiety will decrease Outcome: Progressing   Problem: Elimination: Goal: Will not experience complications related to bowel motility Outcome: Progressing Goal: Will not experience complications related to urinary retention Outcome: Progressing   Problem: Pain Managment: Goal: General experience of comfort will improve Outcome: Progressing   Problem: Safety: Goal: Ability to remain free from injury will improve Outcome: Progressing   Problem: Skin Integrity: Goal: Risk for impaired skin integrity will decrease Outcome: Progressing

## 2022-07-31 LAB — CBC
HCT: 39.1 % (ref 39.0–52.0)
Hemoglobin: 12.8 g/dL — ABNORMAL LOW (ref 13.0–17.0)
MCH: 29.2 pg (ref 26.0–34.0)
MCHC: 32.7 g/dL (ref 30.0–36.0)
MCV: 89.3 fL (ref 80.0–100.0)
Platelets: 362 10*3/uL (ref 150–400)
RBC: 4.38 MIL/uL (ref 4.22–5.81)
RDW: 14.1 % (ref 11.5–15.5)
WBC: 8.9 10*3/uL (ref 4.0–10.5)
nRBC: 0 % (ref 0.0–0.2)

## 2022-07-31 LAB — BASIC METABOLIC PANEL
Anion gap: 11 (ref 5–15)
BUN: 16 mg/dL (ref 6–20)
CO2: 23 mmol/L (ref 22–32)
Calcium: 9.1 mg/dL (ref 8.9–10.3)
Chloride: 104 mmol/L (ref 98–111)
Creatinine, Ser: 0.72 mg/dL (ref 0.61–1.24)
GFR, Estimated: >60 mL/min (ref >60)
Glucose, Bld: 93 mg/dL (ref 70–99)
Potassium: 4 mmol/L (ref 3.5–5.1)
Sodium: 138 mmol/L (ref 135–145)

## 2022-07-31 LAB — VANCOMYCIN, TROUGH: Vancomycin Tr: 19 ug/mL (ref 15–20)
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
## 2022-07-31 NOTE — Consult Note (Signed)
Pharmacy Antibiotic Note  David Bean is a 47 y.o. male admitted on 07/02/2022 with MRSA bacteremia and acute back pain with L2 vertebral fracture. PMH includes IV amphetamine abuse. Pharmacy has been consulted for vancomycin dosing.  Today, 07/31/2022 Day #28 vancomycin (Day #24 since first negative blood cx) Renal: SCr stable Afebrile Repeat Blood cx 12/28 NG TEE 12/29 neg for vegetations ID following  Levels 1/20 Vancomycin trough of 19. Level drawn 6 hours post dose vs 8 hours.  1/19 vancomycin dose 1250 mg IV q8h (last dose 0551 on 1/19), vancomycin trough 11 at 1329 on 1/19.  Of note patient refused dose of Vancomycin 1/18 @ 2310 per nursing documentation. 1/12 vancomcyin dose 1250mg  IV q8h (dose at 21:09 on 1/11), vancomycin trough 15 mcg/mL at 04:37 1/12 1/5  vancomycin 1250mg  IV q8h - given at 0639 with Vancomycin trough 15 mcg/ml at 1339   Previous vancomycin levels 12/31: Dose vancomycin 1250mg  IV q8h - dose at 0619 Vancomycin peak 30 mcg/ml at 08:47 Vancomycin trough 16 mcg/mL at 12:46    Plan: Continue vancomycin 1250mg  IV q8h AUC 539 (goal 400-600) Reorder vancomycin trough on Monday.     Height: 5\' 9"  (175.3 cm) Weight: 72.6 kg (160 lb) IBW/kg (Calculated) : 70.7  Temp (24hrs), Avg:98.2 F (36.8 C), Min:97.8 F (36.6 C), Max:98.5 F (36.9 C)  Recent Labs  Lab 07/26/22 0517 07/27/22 0646 07/30/22 1329 07/31/22 0744 07/31/22 1308  WBC  --  7.5  --  8.9  --   CREATININE 0.68 0.66 0.66 0.72  --   VANCOTROUGH  --   --  11*  --  19     Estimated Creatinine Clearance: 114.2 mL/min (by C-G formula based on SCr of 0.72 mg/dL).    Allergies  Allergen Reactions   Aspirin Anaphylaxis    Antimicrobials this admission: 12/24 Vancomycin >>   Dose adjustments this admission: 12/28  Vanc adj from 1250 q12h to q8h  Microbiology results: 12/23 BCx: 2/4 GPC: MRSA (MIC < 0.15) 12/25 Bcx: MRSA 12/28 Bcx: NGTD 12/27 Sputum: few:  FEW HAEMOPHILUS  INFLUENZAE  BETA LACTAMASE NEGATIVE   Thank you for allowing pharmacy to be a part of this patient's care.  Eleonore Chiquito, PharmD Clinical Pharmacist 07/31/2022 2:00 PM
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
## 2022-08-02 LAB — VANCOMYCIN, TROUGH: Vancomycin Tr: 17 ug/mL (ref 15–20)
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
## 2022-08-02 NOTE — TOC Progression Note (Signed)
Transition of Care [facility]) - Progression Note    Patient Details  Name: David Bean MRN: [id number] Date of Birth: 01-03-1976  [facility]) CM/SW [place], RN Phone Number: 08/02/2022, 9:09 AM  Clinical Narrative:   TOC continues to follow the patient for additional needs, He continues IV ABX here until 2/8    Expected Discharge Plan: Homeless Shelter Barriers to Discharge: Homeless with medical needs  Expected Discharge Plan and Services   Discharge Planning Services: CM Consult   Living arrangements for the past 2 months: Homeless                                       Social Determinants of Health (SDOH) Interventions SDOH Screenings   Food Insecurity: No Food Insecurity (07/03/2022)  Housing: [place]  (07/03/2022)  Transportation Needs: Unmet Transportation Needs (07/08/2022)  Utilities: Not At Risk (07/03/2022)  Tobacco Use: High Risk (07/11/2022)    Readmission Risk Interventions     No data to display
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
## 2022-08-04 LAB — CBC
HCT: 38.4 % — ABNORMAL LOW (ref 39.0–52.0)
Hemoglobin: 12.4 g/dL — ABNORMAL LOW (ref 13.0–17.0)
MCH: 28.6 pg (ref 26.0–34.0)
MCHC: 32.3 g/dL (ref 30.0–36.0)
MCV: 88.7 fL (ref 80.0–100.0)
Platelets: 357 10*3/uL (ref 150–400)
RBC: 4.33 MIL/uL (ref 4.22–5.81)
RDW: 13.9 % (ref 11.5–15.5)
WBC: 7.5 10*3/uL (ref 4.0–10.5)
nRBC: 0 % (ref 0.0–0.2)

## 2022-08-04 LAB — BASIC METABOLIC PANEL
Anion gap: 9 (ref 5–15)
BUN: 17 mg/dL (ref 6–20)
CO2: 24 mmol/L (ref 22–32)
Calcium: 9.1 mg/dL (ref 8.9–10.3)
Chloride: 104 mmol/L (ref 98–111)
Creatinine, Ser: 0.68 mg/dL (ref 0.61–1.24)
GFR, Estimated: >60 mL/min (ref >60)
Glucose, Bld: 90 mg/dL (ref 70–99)
Potassium: 4.3 mmol/L (ref 3.5–5.1)
Sodium: 137 mmol/L (ref 135–145)
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
## 2022-08-05 ENCOUNTER — Inpatient Hospital Stay: Payer: Self-pay

## 2022-08-05 MED ORDER — GADOBUTROL 1 MMOL/ML IV SOLN
7.0000 mL | Freq: Once | INTRAVENOUS | Status: AC | PRN
  Administered 2022-08-05: 7 mL via INTRAVENOUS
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
## 2022-08-05 NOTE — TOC Progression Note (Signed)
Transition of Care [facility]) - Progression Note    Patient Details  Name: David Bean MRN: [id number] Date of Birth: Jul 06, 1976  Transition of Care [facility] CM/SW [place], LCSW Phone Number: 08/05/2022, 9:01 AM  Clinical Narrative:    TOC continues to follow the patient for additional needs, He continues IV ABX here until 2/8     Expected Discharge Plan: Homeless Shelter Barriers to Discharge: Homeless with medical needs  Expected Discharge Plan and Services   Discharge Planning Services: CM Consult   Living arrangements for the past 2 months: Homeless                                       Social Determinants of Health (SDOH) Interventions SDOH Screenings   Food Insecurity: No Food Insecurity (07/03/2022)  Housing: [place]  (07/03/2022)  Transportation Needs: Unmet Transportation Needs (07/08/2022)  Utilities: Not At Risk (07/03/2022)  Tobacco Use: High Risk (07/11/2022)    Readmission Risk Interventions     No data to display
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
## 2022-08-05 NOTE — Progress Notes (Signed)
[place] for Infectious Disease    Date of Admission:  07/02/2022   Total days of antibiotics since 12/27 since he cleared bacteremia   ID: David Bean is a 47 y.o. male with MRSA bacteremia complicated by lumbar discitis @L1 -L2. TEE ruled out vegetation.  Principal Problem:   MRSA bacteremia Active Problems:   Suicidal ideation   Amphetamine abuse (HCC)   L2 vertebral fracture (HCC)   Back pain   Spasm of back muscles   Elevated LFTs   Adrenal mass (HCC)   Ulnar neuropathy of right upper extremity   Compression fracture of L2 lumbar vertebra (HCC)   Hyponatremia   Septicemia due to Staphylococcus aureus (HCC)   Bilateral hydrocele   Severe depression (HCC)   Endocarditis   Thrombocytosis   Homeless   Discitis of lumbar region   Osteomyelitis of lumbar vertebra (HCC)    Subjective: He reports that he does have occasional burning from vancomycin where they replace his PIV to left hand. He also has noticed weakness in grips of hands bilaterally since admit. No fever. + parasthesia  Medications:   fluticasone  1 spray Each Nare BID   lidocaine  1 patch Transdermal Q24H   polyethylene glycol  17 g Oral Daily    Objective: Vital signs in last 24 hours: Temp:  [97.8 F (36.6 C)-98.7 F (37.1 C)] 97.8 F (36.6 C) (01/25 0821) Pulse Rate:  [61-78] 61 (01/25 0821) Resp:  [15-16] 16 (01/25 0821) BP: (96-110)/(67-76) 96/67 (01/25 0821) SpO2:  [96 %-99 %] 99 % (01/25 6160)  Physical Exam  Constitutional: He is oriented to person, place, and time. He appears well-developed and well-nourished. No distress.  HENT:  Mouth/Throat: Oropharynx is clear and moist. No oropharyngeal exudate.  Cardiovascular: Normal rate, regular rhythm and normal heart sounds. Exam reveals no gallop and no friction rub.  No murmur heard.  Pulmonary/Chest: Effort normal and breath sounds normal. No respiratory distress. He has no wheezes.  Abdominal: Soft. Bowel sounds are normal. He  exhibits no distension. There is no tenderness.  Lymphadenopathy:  He has no cervical adenopathy.  Neurological: He is alert and oriented to person, place, and time. Decreased strength R>L hand grip Skin: Skin is warm and dry. No rash noted. No erythema.  Psychiatric: He has a normal mood and affect. His behavior is normal.    Lab Results Recent Labs    08/04/22 0254  WBC 7.5  HGB 12.4*  HCT 38.4*  NA 137  K 4.3  CL 104  CO2 24  BUN 17  CREATININE 0.68   Lab Results  Component Value Date   ESRSEDRATE 52 (H) 07/27/2022   Lab Results  Component Value Date   CRP 1.3 (H) 07/27/2022    Microbiology: 12/23 blod cx x 2 MRSA 12/25 blood cx MRSA 12/27 blood cx NGTD  Methicillin resistant staphylococcus aureus      MIC    CIPROFLOXACIN <=0.5 SENSI... Sensitive    CLINDAMYCIN <=0.25 SENS... Sensitive    ERYTHROMYCIN >=8 RESISTANT Resistant    GENTAMICIN <=0.5 SENSI... Sensitive    Inducible Clindamycin NEGATIVE Sensitive    OXACILLIN >=4 RESISTANT Resistant    RIFAMPIN <=0.5 SENSI... Sensitive    TETRACYCLINE >=16 RESIST... Resistant    TRIMETH/SULFA <=10 SENSIT... Sensitive    VANCOMYCIN <=0.5 SENSI... Sensitive    Studies/Results: No results found.   Assessment/Plan: Disseminated MRSA infection with bacteremia and lumbar discitis. Concern that he has new neuro symptoms. Will plan to get cervical  and thoracic MRI to evaluate for extention of infection  He is currently on vancomycin, if still having difficulty tomorrow, we will finish out the course with daptomycin.   Since his isolate is R to tetracycline - likely to give bactrim as oral suppression after he finished IV therapy. End date is still feb 8th   [facility] Pager: [phone]  08/05/2022, 12:16 PM

## 2022-08-06 LAB — CREATININE, SERUM
Creatinine, Ser: 0.6 mg/dL — ABNORMAL LOW (ref 0.61–1.24)
GFR, Estimated: >60 mL/min (ref >60)

## 2022-08-06 LAB — VANCOMYCIN, TROUGH: Vancomycin Tr: 17 ug/mL (ref 15–20)
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
## 2022-08-06 NOTE — Progress Notes (Signed)
1509 Met with pt in room, Primary nurse stated pt was rude and cursing. Director and security was made aware. This nurse explained to pt the importance of staying for IV abx until feb 8th. Pt was offered to switch nurses or anything appropriate for him to stay. Pt refused and states he would like to leave AMA anyway. Nurse explained AMA means nothing else would be done for him. Pt understands states he still is leaving. IV removed AMA paperwork signed. Security in room.   48 MD and director made aware

## 2022-08-06 NOTE — Consult Note (Signed)
Pharmacy Antibiotic Note  David Bean is a 47 y.o. male admitted on 07/02/2022 with MRSA bacteremia and acute back pain with L2 vertebral fracture. PMH includes IV amphetamine abuse. Pharmacy has been consulted for vancomycin dosing.  Today, 08/06/2022 Day #33 vancomycin (Day #29 since first negative blood cx) Renal: SCr stable Afebrile Repeat Blood cx 12/28 NG TEE 12/29 neg for vegetations Discitis C1-T1 and L1-2 with mild anterior epidural phlegmon at C7-T1 ID following  Levels 1/26 vancomycin trough = 17 mcg/ml on vancomycin 1250mg  IV q8h 1/22 vancomycin trough = 17 mcg/ml on vancomycin 1250mg  IV q8h 1/20 vancomycin dose 1250mg  IV q8h (last dose given 1/21 at 22:46), trough = 17 mcg/mL  1/22 at 05:35 1/19 vancomycin dose 1250 mg IV q8h (last dose 0551 on 1/19), vancomycin trough 11 at 1329 on 1/19.  Of note patient refused dose of Vancomycin 1/18 @ 2310 per nursing documentation. 1/12 vancomcyin dose 1250mg  IV q8h (dose at 21:09 on 1/11), vancomycin trough 15 mcg/mL at 04:37 1/12 1/5  vancomycin 1250mg  IV q8h - given at 0639 with Vancomycin trough 15 mcg/ml at 1339  12/31: Dose vancomycin 1250mg  IV q8h - dose at 0619 Vancomycin peak 30 mcg/ml at 08:47 Vancomycin trough 16 mcg/mL at 12:46    Plan: Continue vancomycin 1250mg  IV q8h AUC 539 (goal 400-600) Monitor renal function and trough level q5-7 days F/u if continues to have burning with vancomycin infusion ID following with end date currently of 08/19/2022 then plans fo rPO antibiotics (likely dose of dalabavancin 1500mg  IV x 1 on day of discharge)  Height: 5\' 9"  (175.3 cm) Weight: 72.6 kg (160 lb) IBW/kg (Calculated) : 70.7  Temp (24hrs), Avg:97.9 F (36.6 C), Min:97.8 F (36.6 C), Max:98 F (36.7 C)  Recent Labs  Lab 07/30/22 1329 07/31/22 0744 07/31/22 1308 08/02/22 0535 08/04/22 0254 08/06/22 0516  WBC  --  8.9  --   --  7.5  --   CREATININE 0.66 0.72  --   --  0.68 0.60*  VANCOTROUGH 11*  --    < > 17   --  17   < > = values in this interval not displayed.     Estimated Creatinine Clearance: 114.2 mL/min (A) (by C-G formula based on SCr of 0.6 mg/dL (L)).    Allergies  Allergen Reactions   Aspirin Anaphylaxis    Antimicrobials this admission: 12/24 Vancomycin >>   Dose adjustments this admission: 12/28  Vanc adj from 1250 q12h to q8h  Microbiology results: 12/23 BCx: 2/4 GPC: MRSA (MIC < 0.15) 12/25 Bcx: MRSA 12/28 Bcx: NGTD 12/27 Sputum: few:  FEW HAEMOPHILUS INFLUENZAE  BETA LACTAMASE NEGATIVE   Thank you for allowing pharmacy to be a part of this patient's care.  Doreene Eland, PharmD, BCPS, BCIDP Work Cell: [phone] 08/06/2022 7:13 AM

## 2022-08-06 NOTE — Discharge Summary (Signed)
Patient signed himself out [place]. I evaluated him this morning, at that time, he was alert and oriented to time place person.  He knows the current event.  I also talked with him in the afternoon when he was walking out of hospital, and based on our conversation, patient knows the consequences of his behavior.  At this point, we cannot stop him from leaving the hospital.  There is no proper medicine to give him at this time.

## 2022-08-06 NOTE — Progress Notes (Signed)
Called lab around 0530 asking about van trough and lab stated that trough was drawn at 0520 but not sent to lab yet. Awaiting results to start vanc.

## 2022-08-06 NOTE — Progress Notes (Signed)
Upon administration of scheduled Vancomycin. Pt asked me " where is my pain medication?" I asked patient would he like it/ He stated "Yes, You know your suppose to give it to me every 8 hours." I stated that his medication is 2.5mg  Oxycodone PRN every 8hours and that he would need to ask me for it if he is experiencing pain. Which he had not done the entire shift up until 1434. I left the room and retrieved the oxycodone and returned immediately back to the patient room and administered the medication to the patient as requested. After the patient took the medication he then asked me " what about me Tylenol?"  I responded would you like some Tylenol? Your tylenol is also PRN Q6 hours." The pt raised his voiced using vulgar language calling me a MF. I was unsure if I heard him correctly and state "Excuse me?" Pt then stated " You MF know that you are suppose to give me my medication every eight hours". I immediately grabbed my belongings and left the patient room and notified the charge nurse and unit manager of the patient aggressive behavior.
